# Patient Record
Sex: Female | Born: 2000
Health system: Southern US, Community
[De-identification: ages and names within clinical notes are randomized; demographics above are authoritative.]

## PROBLEM LIST (undated history)

## (undated) DIAGNOSIS — R51 Headache: Secondary | ICD-10-CM

## (undated) DIAGNOSIS — R519 Headache, unspecified: Secondary | ICD-10-CM

## (undated) HISTORY — DX: Headache, unspecified: R51.9

## (undated) HISTORY — DX: Headache: R51

## (undated) HISTORY — PX: TONSILLECTOMY AND ADENOIDECTOMY: SHX28

---
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
## 2018-01-16 DIAGNOSIS — F3181 Bipolar II disorder: Secondary | ICD-10-CM | POA: Diagnosis not present

## 2018-04-24 DIAGNOSIS — F3181 Bipolar II disorder: Secondary | ICD-10-CM | POA: Diagnosis not present

## 2018-09-11 DIAGNOSIS — F3181 Bipolar II disorder: Secondary | ICD-10-CM | POA: Diagnosis not present

## 2019-02-03 ENCOUNTER — Encounter (HOSPITAL_COMMUNITY): Payer: Self-pay | Admitting: Emergency Medicine

## 2019-02-03 ENCOUNTER — Other Ambulatory Visit: Payer: Self-pay

## 2019-02-03 ENCOUNTER — Emergency Department (HOSPITAL_COMMUNITY)
Admission: EM | Admit: 2019-02-03 | Discharge: 2019-02-03 | Disposition: A | Discharge status: Home / Self Care | Payer: BLUE CROSS/BLUE SHIELD | Attending: Emergency Medicine | Admitting: Emergency Medicine

## 2019-02-03 DIAGNOSIS — B9689 Other specified bacterial agents as the cause of diseases classified elsewhere: Secondary | ICD-10-CM

## 2019-02-03 DIAGNOSIS — N760 Acute vaginitis: Secondary | ICD-10-CM | POA: Diagnosis not present

## 2019-02-03 DIAGNOSIS — Z79899 Other long term (current) drug therapy: Secondary | ICD-10-CM | POA: Insufficient documentation

## 2019-02-03 DIAGNOSIS — Z7722 Contact with and (suspected) exposure to environmental tobacco smoke (acute) (chronic): Secondary | ICD-10-CM | POA: Diagnosis not present

## 2019-02-03 DIAGNOSIS — R1030 Lower abdominal pain, unspecified: Secondary | ICD-10-CM | POA: Diagnosis not present

## 2019-02-03 LAB — URINALYSIS, ROUTINE W REFLEX MICROSCOPIC
Bacteria, UA: NONE SEEN
Bilirubin Urine: NEGATIVE
Glucose, UA: NEGATIVE mg/dL
Hgb urine dipstick: NEGATIVE
Ketones, ur: NEGATIVE mg/dL
Nitrite: NEGATIVE
Protein, ur: NEGATIVE mg/dL
Specific Gravity, Urine: 1.029 (ref 1.005–1.030)
pH: 5 (ref 5.0–8.0)

## 2019-02-03 LAB — COMPREHENSIVE METABOLIC PANEL
ALT: 15 U/L (ref 0–44)
AST: 17 U/L (ref 15–41)
Albumin: 3.6 g/dL (ref 3.5–5.0)
Alkaline Phosphatase: 91 U/L (ref 38–126)
Anion gap: 11 (ref 5–15)
BUN: 12 mg/dL (ref 6–20)
CO2: 24 mmol/L (ref 22–32)
Calcium: 9.5 mg/dL (ref 8.9–10.3)
Chloride: 104 mmol/L (ref 98–111)
Creatinine, Ser: 0.7 mg/dL (ref 0.44–1.00)
GFR calc Af Amer: >60 mL/min (ref >60)
GFR calc non Af Amer: >60 mL/min (ref >60)
Glucose, Bld: 98 mg/dL (ref 70–99)
Potassium: 4.2 mmol/L (ref 3.5–5.1)
Sodium: 139 mmol/L (ref 135–145)
Total Bilirubin: 0.5 mg/dL (ref 0.3–1.2)
Total Protein: 6.8 g/dL (ref 6.5–8.1)

## 2019-02-03 LAB — WET PREP, GENITAL
Sperm: NONE SEEN
Trich, Wet Prep: NONE SEEN
Yeast Wet Prep HPF POC: NONE SEEN

## 2019-02-03 LAB — I-STAT BETA HCG BLOOD, ED (MC, WL, AP ONLY): I-stat hCG, quantitative: <5 m[IU]/mL (ref <5)

## 2019-02-03 LAB — CBC
HCT: 43.5 % (ref 36.0–46.0)
Hemoglobin: 13.4 g/dL (ref 12.0–15.0)
MCH: 24.9 pg — ABNORMAL LOW (ref 26.0–34.0)
MCHC: 30.8 g/dL (ref 30.0–36.0)
MCV: 80.9 fL (ref 80.0–100.0)
Platelets: 278 10*3/uL (ref 150–400)
RBC: 5.38 MIL/uL — ABNORMAL HIGH (ref 3.87–5.11)
RDW: 12.6 % (ref 11.5–15.5)
WBC: 8.2 10*3/uL (ref 4.0–10.5)
nRBC: 0 % (ref 0.0–0.2)

## 2019-02-03 LAB — LIPASE, BLOOD: Lipase: 58 U/L — ABNORMAL HIGH (ref 11–51)

## 2019-02-03 MED ORDER — SODIUM CHLORIDE 0.9% FLUSH: 3.0000 mL | Freq: Once | INTRAVENOUS | Status: DC

## 2019-02-03 MED ORDER — METRONIDAZOLE 500 MG PO TABS: 500.0000 mg | ORAL_TABLET | Freq: Two times a day (BID) | ORAL | 0 refills | Status: AC

## 2019-02-03 MED ORDER — METRONIDAZOLE 500 MG PO TABS
500.0000 mg | ORAL_TABLET | Freq: Once | ORAL | Status: AC
  Administered 2019-02-03: 500 mg via ORAL
  Filled 2019-02-03: qty 1

## 2019-02-03 NOTE — ED Provider Notes (Signed)
[facility]  CSN: [id number] Arrival date & time: 02/03/19 0424  Chief Complaint(s) Abdominal Pain  HPI Sarah Gardner is a 18 y.o. female who presents to the emergency department with several days of lower abdominal cramping.  Last menstrual.  Was approximately April 1 or 2.  Reports that she had a sexual encounter 1 week ago and had vaginal bleeding following that.  This lasted for 2 days.  States that she has had multiple negative pregnancy test at home.  Cramping is intermittent but has gradually worsened since onset.  No associated nausea, vomiting, diarrhea, urinary symptoms.  No fevers or chills.  No respiratory symptoms.  Denies any prior history of STDs and denies any vaginal discharge.  HPI  Past Medical History Past Medical History:  Diagnosis Date  . Headache    Patient Active Problem List   Diagnosis Date Noted  . Bipolar disorder, unspecified (HCC) 10/16/2017   Home Medication(s) Prior to Admission medications   Medication Sig Start Date End Date Taking? Authorizing Provider  ARIPiprazole (ABILIFY) 5 MG tablet Take 1 tablet (5 mg total) by mouth daily. For mood control 10/20/17   Money, Gerlene Burdockravis B, FNP  metroNIDAZOLE (FLAGYL) 500 MG tablet Take 1 tablet (500 mg total) by mouth 2 (two) times daily for 7 days. 02/03/19 02/10/19  Nira Connardama, Pedro Eduardo, MD                                                                                                                                    Past Surgical History Past Surgical History:  Procedure Laterality Date  . TONSILLECTOMY AND ADENOIDECTOMY     Family History Family History  Adopted: Yes    Social History Social History   Tobacco Use  . Smoking status: Passive Smoke Exposure - Never Smoker  . Smokeless tobacco: Never Used  Substance Use Topics  . Alcohol use: No  . Drug use: No   Allergies Patient has no known allergies.  Review of Systems Review of Systems  All other systems are reviewed and are negative for acute change except as noted in the HPI  Physical Exam Vital Signs  I have reviewed the triage vital signs BP 114/66 (BP Location: Right Arm)   Pulse 61   Temp 98.7 F (37.1 C) (Oral)   Resp 18   Wt 80.7 kg   LMP 01/27/2019   SpO2 100%   BMI 32.56 kg/m   Physical Exam Vitals signs reviewed. Exam conducted with a chaperone present.  Constitutional:      General: She is not in acute distress.    Appearance: She is well-developed. She is not diaphoretic.  HENT:     Head: Normocephalic and atraumatic.     Right Ear: External ear normal.     Left Ear: External ear normal.     Nose: Nose normal.  Eyes:     General: No scleral  icterus.    Conjunctiva/sclera: Conjunctivae normal.  Neck:     Musculoskeletal: Normal range of motion.     Trachea: Phonation normal.  Cardiovascular:     Rate and Rhythm: Normal rate and regular rhythm.  Pulmonary:     Effort: Pulmonary effort is normal. No respiratory distress.     Breath sounds: No stridor.  Abdominal:     General: There is no distension.     Tenderness: There is abdominal tenderness ( mild discomfort) in the suprapubic area.  Genitourinary:    Vagina: Vaginal discharge (mild mucousy discharge) and bleeding (small amount of dark coagulated blood noted ) present. No erythema or tenderness.     Cervix: No cervical motion tenderness, discharge or erythema.     Uterus: Not tender.      Adnexa:        Right: No mass or tenderness.         Left: No mass or tenderness.    Musculoskeletal: Normal range of motion.  Neurological:     Mental Status: She is alert and oriented to person, place, and time.  Psychiatric:        Behavior: Behavior normal.     ED Results and Treatments Labs (all labs ordered are listed, but only abnormal results are displayed) Labs Reviewed  WET PREP, GENITAL - Abnormal; Notable for the following components:      Result Value   Clue Cells Wet Prep HPF  POC PRESENT (*)    WBC, Wet Prep HPF POC MANY (*)    All other components within normal limits  LIPASE, BLOOD - Abnormal; Notable for the following components:   Lipase 58 (*)    All other components within normal limits  CBC - Abnormal; Notable for the following components:   RBC 5.38 (*)    MCH 24.9 (*)    All other components within normal limits  URINALYSIS, ROUTINE W REFLEX MICROSCOPIC - Abnormal; Notable for the following components:   APPearance CLOUDY (*)    Leukocytes,Ua LARGE (*)    All other components within normal limits  COMPREHENSIVE METABOLIC PANEL  I-STAT BETA HCG BLOOD, ED ([facility], [facility], [facility] ONLY)  GC/CHLAMYDIA PROBE AMP ([place]) NOT AT [facility]                                                                                                                         EKG  EKG Interpretation  Date/Time:    Ventricular Rate:    PR Interval:    QRS Duration:   QT Interval:    QTC Calculation:   R Axis:     Text Interpretation:        Radiology No results found. Pertinent labs & imaging results that were available during my care of the patient were reviewed by me and considered in my medical decision making (see chart for details).  Medications Ordered in ED Medications  sodium chloride flush (NS) 0.9 % injection 3 mL (has no administration  in time range)  metroNIDAZOLE (FLAGYL) tablet 500 mg (500 mg Oral Given 02/03/19 0644)                                                                                                                                    Procedures Procedures  (including critical care time)  Medical Decision Making / ED Course I have reviewed the nursing notes for this encounter and the patient's prior records (if available in EHR or on provided paperwork).    1 week of intermittent abdominal cramping.  Suprapubic discomfort without evidence of peritonitis.  Pelvic exam not concerning for cervicitis or PID.  Wet prep notable for bacterial  vaginosis.  Negative trichomonas.  GC/chlamydia sent.  Will await results do not believe she needs empiric treatment at this time.  Rest of the labs are reassuring without leukocytosis or anemia.  No significant electrolyte derangements.  Urine notable for leuko-urea but extremely contaminated and not suspicious for infection at this time.  The patient appears reasonably screened and/or stabilized for discharge and I doubt any other medical condition or other [facility] requiring further screening, evaluation, or treatment in the ED at this time prior to discharge.  The patient is safe for discharge with strict return precautions.   Final Clinical Impression(s) / ED Diagnoses Final diagnoses:  Bacterial vaginosis   Disposition: Discharge  Condition: Good  I have discussed the results, Dx and Tx plan with the patient who expressed understanding and agree(s) with the plan. Discharge instructions discussed at great length. The patient was given strict return precautions who verbalized understanding of the instructions. No further questions at time of discharge.    ED Discharge Orders         Ordered    metroNIDAZOLE (FLAGYL) 500 MG tablet  2 times daily     02/03/19 0654           Follow Up: Primary care provider  Schedule an appointment as soon as possible for a visit  As needed      This chart was dictated using voice recognition software.  Despite best efforts to proofread,  errors can occur which can change the documentation meaning.   Nira Conn, MD 02/03/19 [phone]

## 2019-02-03 NOTE — ED Notes (Signed)
Pt reports unprotected sex w/ a new partner approximately one week ago prior to onset of sx.

## 2019-02-03 NOTE — ED Triage Notes (Signed)
Pt in with low, cramping abdominal pain x 1 wk. Denies any n/v/d, or fevers. Endorses pain with urination. LMP 1 wk ago

## 2019-02-05 LAB — GC/CHLAMYDIA PROBE AMP (~~LOC~~) NOT AT ARMC
Chlamydia: NEGATIVE
Neisseria Gonorrhea: NEGATIVE

## 2019-08-05 ENCOUNTER — Emergency Department (HOSPITAL_COMMUNITY)
Admission: EM | Admit: 2019-08-05 | Discharge: 2019-08-05 | Disposition: A | Discharge status: Home / Self Care | Payer: BC Managed Care – PPO | Attending: Emergency Medicine | Admitting: Emergency Medicine

## 2019-08-05 ENCOUNTER — Other Ambulatory Visit: Payer: Self-pay

## 2019-08-05 ENCOUNTER — Encounter (HOSPITAL_COMMUNITY): Payer: Self-pay

## 2019-08-05 ENCOUNTER — Emergency Department (HOSPITAL_COMMUNITY): Payer: BC Managed Care – PPO

## 2019-08-05 DIAGNOSIS — R0789 Other chest pain: Secondary | ICD-10-CM | POA: Diagnosis not present

## 2019-08-05 DIAGNOSIS — R079 Chest pain, unspecified: Secondary | ICD-10-CM | POA: Diagnosis not present

## 2019-08-05 DIAGNOSIS — Z7722 Contact with and (suspected) exposure to environmental tobacco smoke (acute) (chronic): Secondary | ICD-10-CM | POA: Diagnosis not present

## 2019-08-05 DIAGNOSIS — Z79899 Other long term (current) drug therapy: Secondary | ICD-10-CM | POA: Insufficient documentation

## 2019-08-05 LAB — BASIC METABOLIC PANEL
Anion gap: 10 (ref 5–15)
BUN: 15 mg/dL (ref 6–20)
CO2: 23 mmol/L (ref 22–32)
Calcium: 9 mg/dL (ref 8.9–10.3)
Chloride: 108 mmol/L (ref 98–111)
Creatinine, Ser: 0.73 mg/dL (ref 0.44–1.00)
GFR calc Af Amer: >60 mL/min (ref >60)
GFR calc non Af Amer: >60 mL/min (ref >60)
Glucose, Bld: 102 mg/dL — ABNORMAL HIGH (ref 70–99)
Potassium: 4.1 mmol/L (ref 3.5–5.1)
Sodium: 141 mmol/L (ref 135–145)

## 2019-08-05 LAB — TROPONIN I (HIGH SENSITIVITY)
Troponin I (High Sensitivity): <2 ng/L (ref <18)
Troponin I (High Sensitivity): <2 ng/L (ref <18)

## 2019-08-05 LAB — CBC
HCT: 40.9 % (ref 36.0–46.0)
Hemoglobin: 13.4 g/dL (ref 12.0–15.0)
MCH: 26.5 pg (ref 26.0–34.0)
MCHC: 32.8 g/dL (ref 30.0–36.0)
MCV: 81 fL (ref 80.0–100.0)
Platelets: 267 10*3/uL (ref 150–400)
RBC: 5.05 MIL/uL (ref 3.87–5.11)
RDW: 13.3 % (ref 11.5–15.5)
WBC: 6.2 10*3/uL (ref 4.0–10.5)
nRBC: 0 % (ref 0.0–0.2)

## 2019-08-05 LAB — I-STAT BETA HCG BLOOD, ED (MC, WL, AP ONLY): I-stat hCG, quantitative: <5 m[IU]/mL (ref <5)

## 2019-08-05 MED ORDER — SODIUM CHLORIDE 0.9% FLUSH: 3.0000 mL | Freq: Once | INTRAVENOUS | Status: DC

## 2019-08-05 NOTE — Discharge Instructions (Addendum)
Thank you for allowing me to care for you today. Please return to the emergency department if you have new or worsening symptoms. Take your medications as instructed.  ° °

## 2019-08-05 NOTE — ED Provider Notes (Signed)
[facility] Provider Note   CSN: [id number] Arrival date & time: 08/05/19  0252     History   Chief Complaint Chief Complaint  Patient presents with  . Chest Pain    HPI Sarah Gardner is a 18 y.o. female.     18 year old female with past medical history of bipolar disorder presenting to the emergency department for chest tightness.  Reports that it woke her up from her sleep around 2 AM and lasted till about 6:30 AM.  Reports that it felt tight and sharp.  No exacerbating or relieving factors.  History of the same in the past for a couple of episodes which were brief and resolved on their own.  Denies any pain or discomfort at this time.  Denies any shortness of breath, cough, fever, alcohol or drug use.     Past Medical History:  Diagnosis Date  . Headache     Patient Active Problem List   Diagnosis Date Noted  . Bipolar disorder, unspecified (HCC) 10/16/2017    Past Surgical History:  Procedure Laterality Date  . TONSILLECTOMY AND ADENOIDECTOMY       OB History   No obstetric history on file.      Home Medications    Prior to Admission medications   Medication Sig Start Date End Date Taking? Authorizing Provider  ARIPiprazole (ABILIFY) 5 MG tablet Take 1 tablet (5 mg total) by mouth daily. For mood control 10/20/17   Money, Gerlene Burdockravis B, FNP    Family History Family History  Adopted: Yes    Social History Social History   Tobacco Use  . Smoking status: Passive Smoke Exposure - Never Smoker  . Smokeless tobacco: Never Used  Substance Use Topics  . Alcohol use: No  . Drug use: No     Allergies   Patient has no known allergies.   Review of Systems Review of Systems  Constitutional: Negative for chills and fever.  HENT: Negative for ear pain and sore throat.   Eyes: Negative for pain and visual disturbance.  Respiratory: Negative for cough and shortness of breath.   Cardiovascular: Positive for chest  pain. Negative for palpitations.  Gastrointestinal: Negative for abdominal pain, nausea and vomiting.  Genitourinary: Negative for dysuria and hematuria.  Musculoskeletal: Negative for arthralgias and back pain.  Skin: Negative for color change and rash.  Neurological: Negative for dizziness, syncope and headaches.  All other systems reviewed and are negative.    Physical Exam Updated Vital Signs BP 114/77 (BP Location: Right Arm)   Pulse (!) 55   Temp 98.2 F (36.8 C) (Oral)   Resp 16   Ht 5\' 2"  (1.575 m)   Wt 83.9 kg   SpO2 99%   BMI 33.84 kg/m   Physical Exam Vitals signs and nursing note reviewed.  Constitutional:      General: She is not in acute distress.    Appearance: Normal appearance. She is well-developed. She is not ill-appearing or toxic-appearing.  HENT:     Head: Normocephalic.  Eyes:     Conjunctiva/sclera: Conjunctivae normal.     Pupils: Pupils are equal, round, and reactive to light.  Cardiovascular:     Rate and Rhythm: Normal rate and regular rhythm.     Heart sounds: Normal heart sounds.  Pulmonary:     Effort: Pulmonary effort is normal.     Breath sounds: Normal breath sounds.  Chest:     Chest wall: Tenderness (over the sternum) present.  Musculoskeletal:     Right lower leg: No edema.     Left lower leg: No edema.  Skin:    General: Skin is warm and dry.  Neurological:     General: No focal deficit present.     Mental Status: She is alert and oriented to person, place, and time.  Psychiatric:        Mood and Affect: Mood normal.      ED Treatments / Results  Labs (all labs ordered are listed, but only abnormal results are displayed) Labs Reviewed  BASIC METABOLIC PANEL - Abnormal; Notable for the following components:      Result Value   Glucose, Bld 102 (*)    All other components within normal limits  CBC  I-STAT BETA HCG BLOOD, ED ([facility], [facility], [facility] ONLY)  TROPONIN I (HIGH SENSITIVITY)  TROPONIN I (HIGH SENSITIVITY)    EKG  EKG Interpretation  Date/Time:  Sunday August 05 2019 02:58:31 EDT Ventricular Rate:  74 PR Interval:  150 QRS Duration: 98 QT Interval:  386 QTC Calculation: 428 R Axis:   74 Text Interpretation:  Normal sinus rhythm Abnormal ECG No STEMI  Confirmed by Octaviano Glow [phone]) on 08/05/2019 8:22:13 AM   Radiology Dg Chest 2 View  Result Date: 08/05/2019 CLINICAL DATA:  Chest pain. Awoke with chest tightness. EXAM: CHEST - 2 VIEW COMPARISON:  None. FINDINGS: Lung volumes are low.The cardiomediastinal contours are normal. The lungs are clear. Pulmonary vasculature is normal. No consolidation, pleural effusion, or pneumothorax. No acute osseous abnormalities are seen. IMPRESSION: Low lung volumes without acute abnormality. Electronically Signed   By: Keith Rake M.D.   On: 08/05/2019 03:18    Procedures Procedures (including critical care time)  Medications Ordered in ED Medications  sodium chloride flush (NS) 0.9 % injection 3 mL (has no administration in time range)     Initial Impression / Assessment and Plan / ED Course  I have reviewed the triage vital signs and the nursing notes.  Pertinent labs & imaging results that were available during my care of the patient were reviewed by me and considered in my medical decision making (see chart for details).  Clinical Course as of Aug 04 833  Sun Aug 05, 2019  0817 17 y/o with atypical chest pain lasting 4 hours, now resolved. No associated SOB. PERC negative. Trop <2 x2.  And chest xray unremarkable. EKG reviewed by myself and Dr. Langston Masker - appears similar to previous.    [KM]    Clinical Course User Index [KM] Alveria Apley, PA-C       Based on review of vitals, medical screening exam, lab work and/or imaging, there does not appear to be an acute, emergent etiology for the patient's symptoms. Counseled pt on good return precautions and encouraged both PCP and ED follow-up as needed.  Prior to discharge, I also  discussed incidental imaging findings with patient in detail and advised appropriate, recommended follow-up in detail.  Clinical Impression: 1. Atypical chest pain     Disposition: Discharge  Prior to providing a prescription for a controlled substance, I independently reviewed the patient's recent prescription history on the [place]. The patient had no recent or regular prescriptions and was deemed appropriate for a brief, less than 3 day prescription of narcotic for acute analgesia.  This note was prepared with assistance of Systems analyst. Occasional wrong-word or sound-a-like substitutions may have occurred due to the inherent limitations of voice  recognition software.   Final Clinical Impressions(s) / ED Diagnoses   Final diagnoses:  Atypical chest pain    ED Discharge Orders    None       Jeral Pinch 08/05/19 0086    Terald Sleeper, MD 08/05/19 [phone]

## 2019-08-05 NOTE — ED Triage Notes (Signed)
Pt reports chest tightness that woke her up out of her sleep. Denies any other symptoms. Pt a.o, nad noted.

## 2019-08-21 ENCOUNTER — Other Ambulatory Visit: Payer: Self-pay

## 2019-08-22 ENCOUNTER — Ambulatory Visit: Payer: Self-pay | Admitting: Women's Health

## 2019-08-22 DIAGNOSIS — Z0289 Encounter for other administrative examinations: Secondary | ICD-10-CM

## 2019-08-23 ENCOUNTER — Other Ambulatory Visit: Payer: Self-pay

## 2019-08-27 ENCOUNTER — Other Ambulatory Visit: Payer: Self-pay

## 2019-08-27 ENCOUNTER — Encounter: Payer: Self-pay | Admitting: Women's Health

## 2019-08-27 ENCOUNTER — Ambulatory Visit (INDEPENDENT_AMBULATORY_CARE_PROVIDER_SITE_OTHER): Payer: BC Managed Care – PPO | Admitting: Women's Health

## 2019-08-27 VITALS — BP 120/78 | Ht 61.0 in | Wt 194.0 lb

## 2019-08-27 DIAGNOSIS — Z113 Encounter for screening for infections with a predominantly sexual mode of transmission: Secondary | ICD-10-CM | POA: Diagnosis not present

## 2019-08-27 DIAGNOSIS — R7309 Other abnormal glucose: Secondary | ICD-10-CM | POA: Diagnosis not present

## 2019-08-27 DIAGNOSIS — R635 Abnormal weight gain: Secondary | ICD-10-CM | POA: Diagnosis not present

## 2019-08-27 DIAGNOSIS — Z01419 Encounter for gynecological examination (general) (routine) without abnormal findings: Secondary | ICD-10-CM

## 2019-08-27 LAB — WET PREP FOR TRICH, YEAST, CLUE

## 2019-08-27 MED ORDER — METRONIDAZOLE 500 MG PO TABS: 500.0000 mg | ORAL_TABLET | Freq: Two times a day (BID) | ORAL | 0 refills | Status: DC

## 2019-08-27 MED ORDER — DROSPIRENONE-ETHINYL ESTRADIOL 3-0.02 MG PO TABS: 1.0000 | ORAL_TABLET | Freq: Every day | ORAL | 4 refills | Status: DC

## 2019-08-27 MED ORDER — FLUCONAZOLE 150 MG PO TABS: 150.0000 mg | ORAL_TABLET | Freq: Once | ORAL | 1 refills | Status: AC

## 2019-08-27 NOTE — Patient Instructions (Signed)
MVI daily It was nice meeting you!  Carbohydrate Counting for Diabetes Mellitus, Adult  Carbohydrate counting is a method of keeping track of how many carbohydrates you eat. Eating carbohydrates naturally increases the amount of sugar (glucose) in the blood. Counting how many carbohydrates you eat helps keep your blood glucose within normal limits, which helps you manage your diabetes (diabetes mellitus). It is important to know how many carbohydrates you can safely have in each meal. This is different for every person. A diet and nutrition specialist (registered dietitian) can help you make a meal plan and calculate how many carbohydrates you should have at each meal and snack. Carbohydrates are found in the following foods:  Grains, such as breads and cereals.  Dried beans and soy products.  Starchy vegetables, such as potatoes, peas, and corn.  Fruit and fruit juices.  Milk and yogurt.  Sweets and snack foods, such as cake, cookies, candy, chips, and soft drinks. How do I count carbohydrates? There are two ways to count carbohydrates in food. You can use either of the methods or a combination of both. Reading "Nutrition Facts" on packaged food The "Nutrition Facts" list is included on the labels of almost all packaged foods and beverages in the [place] It includes:  The serving size.  Information about nutrients in each serving, including the grams (g) of carbohydrate per serving. To use the "Nutrition Facts":  Decide how many servings you will have.  Multiply the number of servings by the number of carbohydrates per serving.  The resulting number is the total amount of carbohydrates that you will be having. Learning standard serving sizes of other foods When you eat carbohydrate foods that are not packaged or do not include "Nutrition Facts" on the label, you need to measure the servings in order to count the amount of carbohydrates:  Measure the foods that you will eat with a  food scale or measuring cup, if needed.  Decide how many standard-size servings you will eat.  Multiply the number of servings by 15. Most carbohydrate-rich foods have about 15 g of carbohydrates per serving. ? For example, if you eat 8 oz (170 g) of strawberries, you will have eaten 2 servings and 30 g of carbohydrates (2 servings x 15 g = 30 g).  For foods that have more than one food mixed, such as soups and casseroles, you must count the carbohydrates in each food that is included. The following list contains standard serving sizes of common carbohydrate-rich foods. Each of these servings has about 15 g of carbohydrates:   hamburger bun or  English muffin.   oz (15 mL) syrup.   oz (14 g) jelly.  1 slice of bread.  1 six-inch tortilla.  3 oz (85 g) cooked rice or pasta.  4 oz (113 g) cooked dried beans.  4 oz (113 g) starchy vegetable, such as peas, corn, or potatoes.  4 oz (113 g) hot cereal.  4 oz (113 g) mashed potatoes or  of a large baked potato.  4 oz (113 g) canned or frozen fruit.  4 oz (120 mL) fruit juice.  4-6 crackers.  6 chicken nuggets.  6 oz (170 g) unsweetened dry cereal.  6 oz (170 g) plain fat-free yogurt or yogurt sweetened with artificial sweeteners.  8 oz (240 mL) milk.  8 oz (170 g) fresh fruit or one small piece of fruit.  24 oz (680 g) popped popcorn. Example of carbohydrate counting Sample meal  3 oz (85  g) chicken breast.  6 oz (170 g) brown rice.  4 oz (113 g) corn.  8 oz (240 mL) milk.  8 oz (170 g) strawberries with sugar-free whipped topping. Carbohydrate calculation 1. Identify the foods that contain carbohydrates: ? Rice. ? Corn. ? Milk. ? Strawberries. 2. Calculate how many servings you have of each food: ? 2 servings rice. ? 1 serving corn. ? 1 serving milk. ? 1 serving strawberries. 3. Multiply each number of servings by 15 g: ? 2 servings rice x 15 g = 30 g. ? 1 serving corn x 15 g = 15 g. ? 1 serving  milk x 15 g = 15 g. ? 1 serving strawberries x 15 g = 15 g. 4. Add together all of the amounts to find the total grams of carbohydrates eaten: ? 30 g + 15 g + 15 g + 15 g = 75 g of carbohydrates total. Summary  Carbohydrate counting is a method of keeping track of how many carbohydrates you eat.  Eating carbohydrates naturally increases the amount of sugar (glucose) in the blood.  Counting how many carbohydrates you eat helps keep your blood glucose within normal limits, which helps you manage your diabetes.  A diet and nutrition specialist (registered dietitian) can help you make a meal plan and calculate how many carbohydrates you should have at each meal and snack. This information is not intended to replace advice given to you by your health care provider. Make sure you discuss any questions you have with your health care provider. Document Released: 10/04/2005 Document Revised: 04/28/2017 Document Reviewed: 03/17/2016 Elsevier Patient Education  2020 [employer]. Health Maintenance, Female Adopting a healthy lifestyle and getting preventive care are important in promoting health and wellness. Ask your health care provider about:  The right schedule for you to have regular tests and exams.  Things you can do on your own to prevent diseases and keep yourself healthy. What should I know about diet, weight, and exercise? Eat a healthy diet   Eat a diet that includes plenty of vegetables, fruits, low-fat dairy products, and lean protein.  Do not eat a lot of foods that are high in solid fats, added sugars, or sodium. Maintain a healthy weight Body mass index (BMI) is used to identify weight problems. It estimates body fat based on height and weight. Your health care provider can help determine your BMI and help you achieve or maintain a healthy weight. Get regular exercise Get regular exercise. This is one of the most important things you can do for your health. Most adults should:   Exercise for at least 150 minutes each week. The exercise should increase your heart rate and make you sweat (moderate-intensity exercise).  Do strengthening exercises at least twice a week. This is in addition to the moderate-intensity exercise.  Spend less time sitting. Even light physical activity can be beneficial. Watch cholesterol and blood lipids Have your blood tested for lipids and cholesterol at 20 years of age, then have this test every 5 years. Have your cholesterol levels checked more often if:  Your lipid or cholesterol levels are high.  You are older than 20 years of age.  You are at high risk for heart disease. What should I know about cancer screening? Depending on your health history and family history, you may need to have cancer screening at various ages. This may include screening for:  Breast cancer.  Cervical cancer.  Colorectal cancer.  Skin cancer.  Lung cancer.  What should I know about heart disease, diabetes, and high blood pressure? Blood pressure and heart disease  High blood pressure causes heart disease and increases the risk of stroke. This is more likely to develop in people who have high blood pressure readings, are of African descent, or are overweight.  Have your blood pressure checked: ? Every 3-5 years if you are 43-45 years of age. ? Every year if you are 16 years old or older. Diabetes Have regular diabetes screenings. This checks your fasting blood sugar level. Have the screening done:  Once every three years after age 65 if you are at a normal weight and have a low risk for diabetes.  More often and at a younger age if you are overweight or have a high risk for diabetes. What should I know about preventing infection? Hepatitis B If you have a higher risk for hepatitis B, you should be screened for this virus. Talk with your health care provider to find out if you are at risk for hepatitis B infection. Hepatitis C Testing is  recommended for:  Everyone born from [birth year] through 1967.  Anyone with known risk factors for hepatitis C. Sexually transmitted infections (STIs)  Get screened for STIs, including gonorrhea and chlamydia, if: ? You are sexually active and are younger than 20 years of age. ? You are older than 20 years of age and your health care provider tells you that you are at risk for this type of infection. ? Your sexual activity has changed since you were last screened, and you are at increased risk for chlamydia or gonorrhea. Ask your health care provider if you are at risk.  Ask your health care provider about whether you are at high risk for HIV. Your health care provider may recommend a prescription medicine to help prevent HIV infection. If you choose to take medicine to prevent HIV, you should first get tested for HIV. You should then be tested every 3 months for as long as you are taking the medicine. Pregnancy  If you are about to stop having your period (premenopausal) and you may become pregnant, seek counseling before you get pregnant.  Take 400 to 800 micrograms (mcg) of folic acid every day if you become pregnant.  Ask for birth control (contraception) if you want to prevent pregnancy. Osteoporosis and menopause Osteoporosis is a disease in which the bones lose minerals and strength with aging. This can result in bone fractures. If you are 44 years old or older, or if you are at risk for osteoporosis and fractures, ask your health care provider if you should:  Be screened for bone loss.  Take a calcium or vitamin D supplement to lower your risk of fractures.  Be given hormone replacement therapy (HRT) to treat symptoms of menopause. Follow these instructions at home: Lifestyle  Do not use any products that contain nicotine or tobacco, such as cigarettes, e-cigarettes, and chewing tobacco. If you need help quitting, ask your health care provider.  Do not use street drugs.  Do not  share needles.  Ask your health care provider for help if you need support or information about quitting drugs. Alcohol use  Do not drink alcohol if: ? Your health care provider tells you not to drink. ? You are pregnant, may be pregnant, or are planning to become pregnant.  If you drink alcohol: ? Limit how much you use to 0-1 drink a day. ? Limit intake if you are breastfeeding.  Be aware  of how much alcohol is in your drink. In the [place] one drink equals one 12 oz bottle of beer (355 mL), one 5 oz glass of wine (148 mL), or one 1 oz glass of hard liquor (44 mL). General instructions  Schedule regular health, dental, and eye exams.  Stay current with your vaccines.  Tell your health care provider if: ? You often feel depressed. ? You have ever been abused or do not feel safe at home. Summary  Adopting a healthy lifestyle and getting preventive care are important in promoting health and wellness.  Follow your health care provider's instructions about healthy diet, exercising, and getting tested or screened for diseases.  Follow your health care provider's instructions on monitoring your cholesterol and blood pressure. This information is not intended to replace advice given to you by your health care provider. Make sure you discuss any questions you have with your health care provider. Document Released: 04/19/2011 Document Revised: 09/27/2018 Document Reviewed: 09/27/2018 Elsevier Patient Education  2020 [employer]

## 2019-08-27 NOTE — Progress Notes (Signed)
Dyani Babel 07-Aug-2001 [id number]    History:    Presents for annual exam.  Regular monthly cycle, new partner.  Thinks she has received Gardasil.  Normal CBC 07/2019.  Reports gaining about 40 pounds in the last 6 months she relates to overeating due to Covid.  History of anxiety and depression currently on no medication and states is doing well.  Past medical history, past surgical history, family history and social history were all reviewed and documented in the EPIC chart.  Adopted no known family history.  Works in a factory.  ROS:  A ROS was performed and pertinent positives and negatives are included.  Exam:  Vitals:   08/27/19 1122  BP: 120/78  Weight: 194 lb (88 kg)  Height: 5\' 1"  (1.549 m)   Body mass index is 36.66 kg/m.   General appearance:  Normal Thyroid:  Symmetrical, normal in size, without palpable masses or nodularity. Respiratory  Auscultation:  Clear without wheezing or rhonchi Cardiovascular  Auscultation:  Regular rate, without rubs, murmurs or gallops  Edema/varicosities:  Not grossly evident Abdominal  Soft,nontender, without masses, guarding or rebound.  Liver/spleen:  No organomegaly noted  Hernia:  None appreciated  Skin  Inspection:  Grossly normal   Breasts: Examined lying and sitting.     Right: Without masses, retractions, discharge or axillary adenopathy.     Left: Without masses, retractions, discharge or axillary adenopathy. Gentitourinary   Inguinal/mons:  Normal without inguinal adenopathy  External genitalia:  Normal  BUS/Urethra/Skene's glands:  Normal  Vagina:  Normal scant menses, wet prep positive for clues, TNTC bacteria  Cervix:  Normal  Uterus:  normal in size, shape and contour.  Midline and mobile  Adnexa/parametria:     Rt: Without masses or tenderness.   Lt: Without masses or tenderness.  Anus and perineum: Normal    Assessment/Plan:  18 y.o. S BF G0 for annual exam requesting contraception.  Regular monthly  cycle Bacterial vaginosis STD screen Obesity History of anxiety/depression reports doing well currently on no medication  Plan: Contraception options reviewed would like a pill, yes prescription, proper use, slight risk for blood clots and strokes reviewed.  On last day of cycle will start today.  Reviewed importance of condoms especially first month and for infection control.  Flagyl 500 twice daily for the 7 days, alcohol precautions reviewed.  SBEs, exercise, calcium rich foods, MVI daily, leisure activities and self-care encouraged.  Reviewed importance of increasing exercise and decreasing calories/carbs.  Hemoglobin A1c, HIV, RPR, GC/chlamydia, TSH, has had a recent 40 pound weight gain. Instructed to return to office if she has not received Gardasil to complete the series.    [place], 1:59 PM 08/27/2019

## 2019-08-28 LAB — C. TRACHOMATIS/N. GONORRHOEAE RNA
C. trachomatis RNA, TMA: DETECTED — AB
N. gonorrhoeae RNA, TMA: NOT DETECTED

## 2019-08-30 ENCOUNTER — Other Ambulatory Visit: Payer: Self-pay | Admitting: *Deleted

## 2019-08-31 ENCOUNTER — Other Ambulatory Visit: Payer: Self-pay | Admitting: *Deleted

## 2019-08-31 MED ORDER — AZITHROMYCIN 500 MG PO TABS: ORAL_TABLET | ORAL | 0 refills | Status: DC

## 2019-09-04 ENCOUNTER — Telehealth: Payer: Self-pay

## 2019-09-04 NOTE — Telephone Encounter (Signed)
Thanks you gave all the great information.

## 2019-09-04 NOTE — Telephone Encounter (Signed)
Patient called with question about pos Chlamydia result. She said she was reading on internet and read about PID and wanted to know if we tested for it.  I  Reassured her and explained that is a pelvic infection that occurs when disease goes untreated.  I told her it was unlikely she had it since no pain or tenderness and just tested in April.  Reminded her to inform partner and make sure they both have neg TOC prior to intercourse.  We confirmed her TOC date as well.  Also, reminded her to have blood work done at [facility] visit.

## 2019-09-04 NOTE — Telephone Encounter (Signed)
Patient called in voice mail and said she had more questions about her screening result.  I called her back and left message to call.

## 2019-09-17 ENCOUNTER — Other Ambulatory Visit: Payer: Self-pay

## 2019-09-18 ENCOUNTER — Ambulatory Visit (INDEPENDENT_AMBULATORY_CARE_PROVIDER_SITE_OTHER): Payer: BC Managed Care – PPO | Admitting: Women's Health

## 2019-09-18 ENCOUNTER — Encounter: Payer: Self-pay | Admitting: Women's Health

## 2019-09-18 VITALS — BP 118/80

## 2019-09-18 DIAGNOSIS — R635 Abnormal weight gain: Secondary | ICD-10-CM | POA: Diagnosis not present

## 2019-09-18 DIAGNOSIS — Z113 Encounter for screening for infections with a predominantly sexual mode of transmission: Secondary | ICD-10-CM

## 2019-09-18 DIAGNOSIS — R7309 Other abnormal glucose: Secondary | ICD-10-CM | POA: Diagnosis not present

## 2019-09-18 MED ORDER — AZITHROMYCIN 500 MG PO TABS: ORAL_TABLET | ORAL | 0 refills | Status: DC

## 2019-09-18 NOTE — Patient Instructions (Signed)
Chlamydia, Female Chlamydia is an STD (sexually transmitted disease). It is a bacterial infection that spreads (is contagious) through sexual contact. Chlamydia can occur in different areas of the body, including:  The tube that moves urine from the bladder out of the body (urethra).  The lower part of the uterus (cervix).  The throat.  The rectum. This condition is not difficult to treat. However, if left untreated, chlamydia can lead to more serious health problems, including pelvic inflammatory disorder (PID). PID can increase your risk of not being able to have children (sterility). Also, if chlamydia is left untreated and you are pregnant or become pregnant, there is a chance that your baby can become infected during delivery. This may cause serious health problems for the baby. What are the causes? Chlamydia is caused by the bacteria Chlamydia trachomatis. It is passed from an infected partner during sexual activity. Chlamydia can spread through contact with the genitals, mouth, or rectum. What are the signs or symptoms? In some cases, there may not be any symptoms for this condition (asymptomatic), especially early in the infection. If symptoms develop, they may include:  Burning with urination.  Frequent urination.  Vaginal discharge.  Redness, soreness, and swelling (inflammation) of the rectum.  Bleeding or discharge from the rectum.  Abdominal pain.  Pain during sexual intercourse.  Bleeding between menstrual periods.  Itching, burning, or redness in the eyes, or discharge from the eyes. How is this diagnosed? This condition may be diagnosed with:  Urine tests.  Swab tests. Depending on your symptoms, your health care provider may use a cotton swab to collect discharge from your vagina or rectum to test for the bacteria.  A pelvic exam. How is this treated? This condition is treated with antibiotic medicines. If you are pregnant, certain types of antibiotics will  need to be avoided. Follow these instructions at home: Medicines  Take over-the-counter and prescription medicines only as told by your health care provider.  Take your antibiotic medicine as told by your health care provider. Do not stop taking the antibiotic even if you start to feel better. Sexual activity  Tell sexual partners about your infection. This includes any oral, anal, or vaginal sex partners you have had within 60 days of when your symptoms started. Sexual partners should also be treated, even if they have no signs of the disease.  Do not have sex until you and your sexual partners have completed treatment and your health care provider says it is okay. If your health care provider prescribed you a single dose treatment, wait 7 days after taking the treatment before having sex. General instructions  It is your responsibility to get your test results. Ask your health care provider, or the department performing the test, when your results will be ready.  Get plenty of rest.  Eat a healthy, well-balanced diet.  Drink enough fluids to keep your urine clear or pale yellow.  Keep all follow-up visits as told by your health care provider. This is important. You may need to be tested for infection again 3 months after treatment. How is this prevented? The only sure way to prevent chlamydia is to avoid having sex. However, you can lower your risk by:  Using latex condoms correctly every time you have sex.  Not having multiple sexual partners.  Asking if your sexual partner has been tested for STIs and had negative results. Contact a health care provider if:  You develop new symptoms or your symptoms do not get   better after completing treatment.  You have a fever or chills.  You have pain during sexual intercourse. Get help right away if:  Your pain gets worse and does not get better with medicine.  You develop flu-like symptoms, such as night sweats, sore throat, or  muscle aches.  You experience nausea or vomiting.  You have difficulty swallowing.  You have bleeding between periods or after sex.  You have irregular menstrual periods.  You have abdominal or lower back pain that does not get better with medicine.  You feel weak or dizzy, or you faint.  You are pregnant and you develop symptoms of chlamydia. Summary  Chlamydia is an STD (sexually transmitted disease). It is a bacterial infection that spreads (is contagious) through sexual contact.  This condition is not difficult to treat, however. If left untreated, chlamydia can lead to more serious health problems, including pelvic inflammatory disease (PID).  In some cases, there may not be any symptoms for this condition (asymptomatic).  This condition is treated with antibiotic medicines.  Using latex condoms correctly every time you have sex can help prevent chlamydia. This information is not intended to replace advice given to you by your health care provider. Make sure you discuss any questions you have with your health care provider. Document Released: 07/14/2005 Document Revised: 03/28/2018 Document Reviewed: 09/20/2016 Elsevier Patient Education  2020 [employer] Inc.  

## 2019-09-18 NOTE — Progress Notes (Signed)
18 year old S BF G0 presents for test of cure chlamydia.  Treated with Zithromax 1 g.  08/27/2019 Chlamydia found at annual exam with routine screening asymptomatic.  States past partner positive chlamydia 4/ 2020, she was tested 01/2019 was negative and not treated.  States current partner had STD screen negative chlamydia and was not treated.  Denies vaginal discharge, urinary symptoms, abdominal pain, or fever.  Monthly cycle/condoms no known medical problems.  Was prescribed Yaz at annual exam has not started yet.  Works in a factory.  Exam: Appears well.  External genitalia within normal limits, speculum exam no discharge or lesions.  GC/Chlamydia culture taken, bimanual no CMT or adnexal tenderness.  Test of cure chlamydia  Plan: Left office prior to lab work at annual exam we will check HIV, RPR.  Prescription for Zithromax 1 g for partner, reviewed importance of abstaining or condom for at least 1 month.  Contraception reviewed, Yaz start up instructions discussed.  Reviewed importance of condoms especially for the first month and infection control until permanent partner.

## 2019-09-19 LAB — RPR: RPR Ser Ql: NONREACTIVE

## 2019-09-19 LAB — TSH: TSH: 1.22 mIU/L

## 2019-09-19 LAB — HEMOGLOBIN A1C
Hgb A1c MFr Bld: 5.4 % of total Hgb (ref <5.7)
Mean Plasma Glucose: 108 (calc)
eAG (mmol/L): 6 (calc)

## 2019-09-19 LAB — C. TRACHOMATIS/N. GONORRHOEAE RNA
C. trachomatis RNA, TMA: NOT DETECTED
N. gonorrhoeae RNA, TMA: NOT DETECTED

## 2019-09-19 LAB — HIV ANTIBODY (ROUTINE TESTING W REFLEX): HIV 1&2 Ab, 4th Generation: NONREACTIVE

## 2019-09-24 ENCOUNTER — Encounter: Payer: Self-pay | Admitting: Women's Health

## 2019-10-25 ENCOUNTER — Other Ambulatory Visit: Payer: Self-pay

## 2019-10-27 ENCOUNTER — Other Ambulatory Visit: Payer: Self-pay

## 2019-10-27 ENCOUNTER — Encounter (HOSPITAL_COMMUNITY): Payer: Self-pay

## 2019-10-27 ENCOUNTER — Ambulatory Visit (HOSPITAL_COMMUNITY)
Admission: EM | Admit: 2019-10-27 | Discharge: 2019-10-27 | Disposition: A | Discharge status: Home / Self Care | Payer: BC Managed Care – PPO | Attending: Emergency Medicine | Admitting: Emergency Medicine

## 2019-10-27 DIAGNOSIS — N764 Abscess of vulva: Secondary | ICD-10-CM | POA: Insufficient documentation

## 2019-10-27 DIAGNOSIS — Z113 Encounter for screening for infections with a predominantly sexual mode of transmission: Secondary | ICD-10-CM | POA: Diagnosis not present

## 2019-10-27 LAB — HIV ANTIBODY (ROUTINE TESTING W REFLEX): HIV Screen 4th Generation wRfx: NONREACTIVE

## 2019-10-27 MED ORDER — MUPIROCIN 2 % EX OINT: 1.0000 "application " | TOPICAL_OINTMENT | Freq: Two times a day (BID) | CUTANEOUS | 0 refills | Status: DC

## 2019-10-27 NOTE — ED Triage Notes (Signed)
Pt states has new "partner" and requests testing for STI. Denies any vaginal discharge,bleeding or odor, no abd pain. States has "bump" on vaginal area. Denies dysuria sx.

## 2019-10-27 NOTE — Discharge Instructions (Signed)
Apply warm compress to bump, bactroban twice daily, may drain on its own, follow up if not resolving, developing multiple lesions, increased pain or swelling with bump  We are testing you for Gonorrhea, Chlamydia, Trichomonas, HIV, and Syphillis. We will call you if anything is positive and let you know if you require any further treatment. Please inform partners of any positive results.   Please return if symptoms not improving with treatment, development of fever, nausea, vomiting, abdominal pain.

## 2019-10-28 LAB — RPR: RPR Ser Ql: NONREACTIVE

## 2019-10-28 NOTE — ED Provider Notes (Signed)
[facility]    CSN: [id number] Arrival date & time: 10/27/19  1324      History   Chief Complaint Chief Complaint  Patient presents with  . Exposure to STD    HPI Sarah Gardner is a 19 y.o. female history of bipolar disorder presenting today for STD screening and evaluation of genital bumps.  Patient states that she has had a new sexual partner recently and would like to be screened for STDs for safety.  She denies any symptoms or any known exposures.  Denies pelvic pain, fevers, nausea or vomiting.  Denies abnormal discharge, itching or irritation.  Denies dysuria, increased frequency or urgency.  She has noted a bump on her vaginal area that occasionally expresses white substance.  Has had this previously and resolved on its own.  Has some tenderness associated with it.  HPI  Past Medical History:  Diagnosis Date  . Headache     Patient Active Problem List   Diagnosis Date Noted  . Bipolar disorder, unspecified (HCC) 10/16/2017    Past Surgical History:  Procedure Laterality Date  . TONSILLECTOMY AND ADENOIDECTOMY      OB History    Gravida  0   Para  0   Term  0   Preterm  0   AB  0   Living  0     SAB  0   TAB  0   Ectopic  0   Multiple  0   Live Births  0            Home Medications    Prior to Admission medications   Medication Sig Start Date End Date Taking? Authorizing Provider  mupirocin ointment (BACTROBAN) 2 % Apply 1 application topically 2 (two) times daily. 10/27/19   Ailana Cuadrado, Junius Creamer, PA-C    Family History Family History  Adopted: Yes  Problem Relation Age of Onset  . Obesity Mother   . Hypertension Father     Social History Social History   Tobacco Use  . Smoking status: Passive Smoke Exposure - Never Smoker  . Smokeless tobacco: Never Used  Substance Use Topics  . Alcohol use: No  . Drug use: No     Allergies   Patient has no known allergies.   Review of Systems Review of Systems   Constitutional: Negative for fever.  Respiratory: Negative for shortness of breath.   Cardiovascular: Negative for chest pain.  Gastrointestinal: Negative for abdominal pain, diarrhea, nausea and vomiting.  Genitourinary: Positive for genital sores. Negative for dysuria, flank pain, hematuria, menstrual problem, vaginal bleeding, vaginal discharge and vaginal pain.  Musculoskeletal: Negative for back pain.  Skin: Negative for rash.  Neurological: Negative for dizziness, light-headedness and headaches.     Physical Exam Triage Vital Signs ED Triage Vitals  Enc Vitals Group     BP 10/27/19 1536 117/77     Pulse Rate 10/27/19 1536 65     Resp 10/27/19 1536 18     Temp 10/27/19 1536 98.4 F (36.9 C)     Temp Source 10/27/19 1536 Oral     SpO2 10/27/19 1536 100 %     Weight --      Height --      Head Circumference --      Peak Flow --      Pain Score 10/27/19 1534 0     Pain Loc --      Pain Edu? --      Excl. in GC? --  No data found.  Updated Vital Signs BP 117/77 (BP Location: Left Arm)   Pulse 65   Temp 98.4 F (36.9 C) (Oral)   Resp 18   LMP 10/22/2019   SpO2 100%   Visual Acuity Right Eye Distance:   Left Eye Distance:   Bilateral Distance:    Right Eye Near:   Left Eye Near:    Bilateral Near:     Physical Exam Vitals and nursing note reviewed.  Constitutional:      Appearance: She is well-developed.     Comments: No acute distress  HENT:     Head: Normocephalic and atraumatic.     Nose: Nose normal.  Eyes:     Conjunctiva/sclera: Conjunctivae normal.  Cardiovascular:     Rate and Rhythm: Normal rate.  Pulmonary:     Effort: Pulmonary effort is normal. No respiratory distress.  Abdominal:     General: There is no distension.  Genitourinary:    Comments: Normal external female genitalia, right labia minora with area of 0.5 cm induration and swelling with central discoloration/skin thinning; no vesicles or ulcerative lesions noted Vaginal  mucosa pink; small amount of discharge present Musculoskeletal:        General: Normal range of motion.     Cervical back: Neck supple.  Skin:    General: Skin is warm and dry.  Neurological:     Mental Status: She is alert and oriented to person, place, and time.      UC Treatments / Results  Labs (all labs ordered are listed, but only abnormal results are displayed) Labs Reviewed  HIV ANTIBODY (ROUTINE TESTING W REFLEX)  RPR  CERVICOVAGINAL ANCILLARY ONLY    EKG   Radiology No results found.  Procedures Procedures (including critical care time)  Medications Ordered in UC Medications - No data to display  Initial Impression / Assessment and Plan / UC Course  I have reviewed the triage vital signs and the nursing notes.  Pertinent labs & imaging results that were available during my care of the patient were reviewed by me and considered in my medical decision making (see chart for details).     Genital lesion suggestive of likely small abscess, does not appear suggestive of herpes at this time.  Will provide Bactroban to apply topically at recommend warm compresses.  Follow-up if swelling and pain worsening with this lesion.  STD screening pending, vaginal swab obtained to check for gonorrhea chlamydia and trichomonas.  HIV and RPR obtained.  We will call with results and provide treatment as needed. Final Clinical Impressions(s) / UC Diagnoses   Final diagnoses:  Screening examination for STD (sexually transmitted disease)  Abscess of labia     Discharge Instructions     Apply warm compress to bump, bactroban twice daily, may drain on its own, follow up if not resolving, developing multiple lesions, increased pain or swelling with bump  We are testing you for Gonorrhea, Chlamydia, Trichomonas, HIV, and Syphillis. We will call you if anything is positive and let you know if you require any further treatment. Please inform partners of any positive results.    Please return if symptoms not improving with treatment, development of fever, nausea, vomiting, abdominal pain.    ED Prescriptions    Medication Sig Dispense Auth. Provider   mupirocin ointment (BACTROBAN) 2 % Apply 1 application topically 2 (two) times daily. 30 g Dawood Spitler, [place] C, PA-C     PDMP not reviewed this encounter.   Khamauri Bauernfeind, [employer]  C, PA-C 10/28/19 2929

## 2019-10-29 ENCOUNTER — Ambulatory Visit: Payer: BC Managed Care – PPO | Admitting: Women's Health

## 2019-10-30 LAB — CERVICOVAGINAL ANCILLARY ONLY
Chlamydia: NEGATIVE
Neisseria Gonorrhea: NEGATIVE
Trichomonas: NEGATIVE

## 2020-06-02 ENCOUNTER — Other Ambulatory Visit: Payer: Self-pay

## 2020-06-02 ENCOUNTER — Emergency Department (HOSPITAL_COMMUNITY)
Admission: EM | Admit: 2020-06-02 | Discharge: 2020-06-02 | Disposition: A | Discharge status: Home / Self Care | Payer: BC Managed Care – PPO | Attending: Emergency Medicine | Admitting: Emergency Medicine

## 2020-06-02 ENCOUNTER — Encounter (HOSPITAL_COMMUNITY): Payer: Self-pay

## 2020-06-02 DIAGNOSIS — R21 Rash and other nonspecific skin eruption: Secondary | ICD-10-CM | POA: Insufficient documentation

## 2020-06-02 DIAGNOSIS — Z7689 Persons encountering health services in other specified circumstances: Secondary | ICD-10-CM

## 2020-06-02 DIAGNOSIS — Z202 Contact with and (suspected) exposure to infections with a predominantly sexual mode of transmission: Secondary | ICD-10-CM | POA: Diagnosis not present

## 2020-06-02 DIAGNOSIS — Z7722 Contact with and (suspected) exposure to environmental tobacco smoke (acute) (chronic): Secondary | ICD-10-CM | POA: Diagnosis not present

## 2020-06-02 DIAGNOSIS — Z113 Encounter for screening for infections with a predominantly sexual mode of transmission: Secondary | ICD-10-CM | POA: Diagnosis not present

## 2020-06-02 LAB — URINALYSIS, ROUTINE W REFLEX MICROSCOPIC
Bilirubin Urine: NEGATIVE
Glucose, UA: NEGATIVE mg/dL
Ketones, ur: NEGATIVE mg/dL
Nitrite: NEGATIVE
Protein, ur: NEGATIVE mg/dL
Specific Gravity, Urine: 1.023 (ref 1.005–1.030)
pH: 6 (ref 5.0–8.0)

## 2020-06-02 LAB — HIV ANTIBODY (ROUTINE TESTING W REFLEX): HIV Screen 4th Generation wRfx: NONREACTIVE

## 2020-06-02 LAB — PREGNANCY, URINE: Preg Test, Ur: NEGATIVE

## 2020-06-02 NOTE — ED Notes (Signed)
Pt left without d/c paperwork

## 2020-06-02 NOTE — Discharge Instructions (Addendum)
As discussed, today's evaluation has been generally reassuring.  Labs have been sent on your behalf to evaluate for potential infection.  You will be made aware of abnormal findings.  Return here for concerning changes in your condition.

## 2020-06-02 NOTE — ED Provider Notes (Signed)
[facility] EMERGENCY DEPARTMENT Provider Note   CSN: [id number] Arrival date & time: 06/02/20  8416     History Chief Complaint  Patient presents with  . Rash    Camora Tremain is a 19 y.o. female.  HPI    Patient presents concern of possible STD exposure, as well as rash. Patient notes that she had unprotected sex about 3 weeks ago.  This is within a new age with him she was previously familiar, had an episode about 6 months ago, with concern for STD at that time. She now notes that over the past 3 weeks she has had itchiness in both legs around rash, no discharge, no bleeding, last menstrual period was 2 days ago. No fever, chills, nausea, vomiting, chest pain, dyspnea. No abdominal pain. Past Medical History:  Diagnosis Date  . Headache     Patient Active Problem List   Diagnosis Date Noted  . Bipolar disorder, unspecified (HCC) 10/16/2017    Past Surgical History:  Procedure Laterality Date  . TONSILLECTOMY AND ADENOIDECTOMY       OB History    Gravida  0   Para  0   Term  0   Preterm  0   AB  0   Living  0     SAB  0   TAB  0   Ectopic  0   Multiple  0   Live Births  0           Family History  Adopted: Yes  Problem Relation Age of Onset  . Obesity Mother   . Hypertension Father     Social History   Tobacco Use  . Smoking status: Passive Smoke Exposure - Never Smoker  . Smokeless tobacco: Never Used  Vaping Use  . Vaping Use: Never used  Substance Use Topics  . Alcohol use: No  . Drug use: No    Home Medications Prior to Admission medications   Medication Sig Start Date End Date Taking? Authorizing Provider  mupirocin ointment (BACTROBAN) 2 % Apply 1 application topically 2 (two) times daily. 10/27/19   Wieters, Hallie C, PA-C    Allergies    Patient has no known allergies.  Review of Systems   Review of Systems  Constitutional:       Per HPI, otherwise negative  HENT:       Per HPI, otherwise  negative  Respiratory:       Per HPI, otherwise negative  Cardiovascular:       Per HPI, otherwise negative  Gastrointestinal: Negative for vomiting.  Endocrine:       Negative aside from HPI  Genitourinary:       Neg aside from HPI   Musculoskeletal:       Per HPI, otherwise negative  Skin: Positive for rash.  Allergic/Immunologic: Negative for immunocompromised state.  Neurological: Negative for syncope.    Physical Exam Updated Vital Signs BP 111/73 (BP Location: Right Arm)   Pulse 70   Temp 98.7 F (37.1 C) (Oral)   Resp 16   Ht 5\' 2"  (1.575 m)   Wt 90.7 kg   LMP 06/02/2020   SpO2 100%   BMI 36.58 kg/m   Physical Exam Vitals and nursing note reviewed.  Constitutional:      General: She is not in acute distress.    Appearance: She is well-developed.  HENT:     Head: Normocephalic and atraumatic.  Eyes:     Conjunctiva/sclera: Conjunctivae normal.  Cardiovascular:     Rate and Rhythm: Normal rate and regular rhythm.  Pulmonary:     Effort: Pulmonary effort is normal. No respiratory distress.     Breath sounds: Normal breath sounds. No stridor.  Abdominal:     General: There is no distension.  Skin:    General: Skin is warm and dry.     Comments: On both ankles medially there are well-healing wounds consistent with prior insect bites.  On the superior chest wall there are areas of pillars keratosis, otherwise unremarkable  Neurological:     Mental Status: She is alert and oriented to person, place, and time.     Cranial Nerves: No cranial nerve deficit.     ED Results / Procedures / Treatments   Labs (all labs ordered are listed, but only abnormal results are displayed) Labs Reviewed  URINALYSIS, ROUTINE W REFLEX MICROSCOPIC  HIV ANTIBODY (ROUTINE TESTING W REFLEX)  RPR  POC URINE PREG, ED  GC/CHLAMYDIA PROBE AMP ([place]) NOT AT [facility]    EKG None  Radiology No results found.  Procedures Procedures (including critical care  time)  Medications Ordered in ED Medications - No data to display  ED Course  I have reviewed the triage vital signs and the nursing notes.  Pertinent labs & imaging results that were available during my care of the patient were reviewed by me and considered in my medical decision making (see chart for details).  Well-appearing young female presents after unprotected sex now with concern for sexually transmitted disease as a cause rash. Rash is unremarkable in appearance, no evidence for infection, bacteremia, sepsis, with reassuring vital signs, physical exam findings as well. With consideration of STD, patient had urinalysis, and self swab with STD testing as well as syphilis, HIV tests all sent. Absent hemodynamic stability patient is appropriate to follow-up these labs as an outpatient. Final Clinical Impression(s) / ED Diagnoses Final diagnoses:  Rash  Encounter for assessment of sexually transmitted disease exposure     Gerhard Munch, MD 06/02/20 1524

## 2020-06-02 NOTE — ED Triage Notes (Signed)
Pt reports rash all over for the past 3 weeks, intermittent, pt denies any new lotions, soaps or detergents.

## 2020-06-03 LAB — GC/CHLAMYDIA PROBE AMP (~~LOC~~) NOT AT ARMC
Chlamydia: POSITIVE — AB
Comment: NEGATIVE
Comment: NORMAL
Neisseria Gonorrhea: NEGATIVE

## 2020-06-04 ENCOUNTER — Telehealth: Payer: Self-pay | Admitting: Medical

## 2020-06-04 DIAGNOSIS — A749 Chlamydial infection, unspecified: Secondary | ICD-10-CM

## 2020-06-04 MED ORDER — AZITHROMYCIN 250 MG PO TABS: 1000.0000 mg | ORAL_TABLET | Freq: Once | ORAL | 0 refills | Status: AC

## 2020-06-04 NOTE — Telephone Encounter (Addendum)
Sarah Gardner tested positive for  Chlamydia. Patient was called by RN and allergies and pharmacy confirmed. Rx sent to pharmacy of choice.   Marny Lowenstein, PA-C 06/04/2020 10:44 AM      ----- Message from Kathe Becton, RN sent at 06/04/2020  9:01 AM EDT ----- This patient tested positive for :  chlamydia  She has, " NKDA", I have informed the patient of her results and confirmed her pharmacy is correct in her chart. Please send Rx.   Thank you,   Kathe Becton, RN   Results faxed to [facility] Department.

## 2020-06-05 LAB — RPR: RPR Ser Ql: NONREACTIVE — AB

## 2020-07-08 ENCOUNTER — Encounter (HOSPITAL_COMMUNITY): Payer: Self-pay | Admitting: Emergency Medicine

## 2020-07-08 ENCOUNTER — Ambulatory Visit (HOSPITAL_COMMUNITY)
Admission: EM | Admit: 2020-07-08 | Discharge: 2020-07-08 | Disposition: A | Discharge status: Home / Self Care | Payer: BC Managed Care – PPO | Attending: Family Medicine | Admitting: Family Medicine

## 2020-07-08 ENCOUNTER — Other Ambulatory Visit: Payer: Self-pay

## 2020-07-08 DIAGNOSIS — N898 Other specified noninflammatory disorders of vagina: Secondary | ICD-10-CM

## 2020-07-08 DIAGNOSIS — Z3202 Encounter for pregnancy test, result negative: Secondary | ICD-10-CM | POA: Diagnosis not present

## 2020-07-08 DIAGNOSIS — Z8619 Personal history of other infectious and parasitic diseases: Secondary | ICD-10-CM | POA: Insufficient documentation

## 2020-07-08 DIAGNOSIS — Z79899 Other long term (current) drug therapy: Secondary | ICD-10-CM | POA: Insufficient documentation

## 2020-07-08 DIAGNOSIS — R1030 Lower abdominal pain, unspecified: Secondary | ICD-10-CM | POA: Insufficient documentation

## 2020-07-08 DIAGNOSIS — R109 Unspecified abdominal pain: Secondary | ICD-10-CM | POA: Diagnosis not present

## 2020-07-08 DIAGNOSIS — R197 Diarrhea, unspecified: Secondary | ICD-10-CM | POA: Diagnosis not present

## 2020-07-08 LAB — POCT URINALYSIS DIPSTICK, ED / UC
Bilirubin Urine: NEGATIVE
Glucose, UA: NEGATIVE mg/dL
Hgb urine dipstick: NEGATIVE
Ketones, ur: NEGATIVE mg/dL
Leukocytes,Ua: NEGATIVE
Nitrite: NEGATIVE
Protein, ur: NEGATIVE mg/dL
Specific Gravity, Urine: >=1.03 (ref 1.005–1.030)
Urobilinogen, UA: 0.2 mg/dL (ref 0.0–1.0)
pH: 5 (ref 5.0–8.0)

## 2020-07-08 LAB — POC URINE PREG, ED: Preg Test, Ur: NEGATIVE

## 2020-07-08 MED ORDER — METRONIDAZOLE 500 MG PO TABS: 500.0000 mg | ORAL_TABLET | Freq: Two times a day (BID) | ORAL | 0 refills | Status: DC

## 2020-07-08 NOTE — ED Provider Notes (Signed)
[facility]    CSN: [id number] Arrival date & time: 07/08/20  3875      History   Chief Complaint Chief Complaint  Patient presents with  . Abdominal Pain    HPI Sarah Gardner is a 19 y.o. female.   Patient is a 19 year old female who presents today for assessment of generalized lower abdominal discomfort that is been intermittent x2 weeks with diarrhea.  Was diagnosed with chlamydia on 06/04/2020 and treated with azithromycin.  Reports has not been reexposed since then.  Here today with concerns for possible PID for concern that the STD did not clear up.  Denies any vaginal discharge, irritation, dysuria, hematuria or urinary frequency. Patient's last menstrual period was 06/24/2020.      Past Medical History:  Diagnosis Date  . Headache     Patient Active Problem List   Diagnosis Date Noted  . Bipolar disorder, unspecified (HCC) 10/16/2017    Past Surgical History:  Procedure Laterality Date  . TONSILLECTOMY AND ADENOIDECTOMY      OB History    Gravida  0   Para  0   Term  0   Preterm  0   AB  0   Living  0     SAB  0   TAB  0   Ectopic  0   Multiple  0   Live Births  0            Home Medications    Prior to Admission medications   Medication Sig Start Date End Date Taking? Authorizing Provider  ARIPiprazole (ABILIFY) 10 MG tablet Take 10 mg by mouth daily.   Yes [provider]  metroNIDAZOLE (FLAGYL) 500 MG tablet Take 1 tablet (500 mg total) by mouth 2 (two) times daily. 07/08/20   Dahlia Byes A, NP  mupirocin ointment (BACTROBAN) 2 % Apply 1 application topically 2 (two) times daily. 10/27/19   Wieters, Junius Creamer, PA-C    Family History Family History  Adopted: Yes  Problem Relation Age of Onset  . Obesity Mother   . Hypertension Father     Social History Social History   Tobacco Use  . Smoking status: Passive Smoke Exposure - Never Smoker  . Smokeless tobacco: Never Used  Vaping Use  . Vaping Use:  Never used  Substance Use Topics  . Alcohol use: No  . Drug use: No     Allergies   Patient has no known allergies.   Review of Systems Review of Systems   Physical Exam Triage Vital Signs ED Triage Vitals [07/08/20 0831]  Enc Vitals Group     BP 115/64     Pulse Rate 69     Resp 18     Temp 98.3 F (36.8 C)     Temp Source Oral     SpO2 97 %     Weight      Height      Head Circumference      Peak Flow      Pain Score 4     Pain Loc      Pain Edu?      Excl. in GC?    No data found.  Updated Vital Signs BP 115/64 (BP Location: Right Arm)   Pulse 69   Temp 98.3 F (36.8 C) (Oral)   Resp 18   LMP 06/24/2020   SpO2 97%   Visual Acuity Right Eye Distance:   Left Eye Distance:   Bilateral Distance:  Right Eye Near:   Left Eye Near:    Bilateral Near:     Physical Exam Vitals and nursing note reviewed.  Constitutional:      General: She is not in acute distress.    Appearance: Normal appearance. She is not ill-appearing, toxic-appearing or diaphoretic.  HENT:     Head: Normocephalic.     Nose: Nose normal.     Mouth/Throat:     Pharynx: Oropharynx is clear.  Eyes:     Conjunctiva/sclera: Conjunctivae normal.  Pulmonary:     Effort: Pulmonary effort is normal.  Abdominal:     Palpations: Abdomen is soft.     Tenderness: There is no abdominal tenderness.  Genitourinary:    Vagina: Vaginal discharge present.     Cervix: Normal.     Adnexa: Right adnexa normal and left adnexa normal.     Comments: Thin, watery/milky white discharge at vaginal opening.  No external lesions.  Internal exam with discharge noted. No cervical motion tenderness.  No friability. No lesions   Musculoskeletal:        General: Normal range of motion.     Cervical back: Normal range of motion.  Skin:    General: Skin is warm and dry.     Findings: No rash.  Neurological:     Mental Status: She is alert.  Psychiatric:        Mood and Affect: Mood normal.       UC Treatments / Results  Labs (all labs ordered are listed, but only abnormal results are displayed) Labs Reviewed  POCT URINALYSIS DIPSTICK, ED / UC  POC URINE PREG, ED  CERVICOVAGINAL ANCILLARY ONLY    EKG   Radiology No results found.  Procedures Procedures (including critical care time)  Medications Ordered in UC Medications - No data to display  Initial Impression / Assessment and Plan / UC Course  I have reviewed the triage vital signs and the nursing notes.  Pertinent labs & imaging results that were available during my care of the patient were reviewed by me and considered in my medical decision making (see chart for details).     Vaginal discharge Treating for BV based on exam and symptoms.  No concern for PID at this time. Swab sent for testing. Urine without infection or pregnancy. Follow up as needed for continued or worsening symptoms  Final Clinical Impressions(s) / UC Diagnoses   Final diagnoses:  Vaginal discharge     Discharge Instructions     Treating you for BV.  We will call with any positive results of your swab. Follow up as needed for continued or worsening symptoms     ED Prescriptions    Medication Sig Dispense Auth. Provider   metroNIDAZOLE (FLAGYL) 500 MG tablet Take 1 tablet (500 mg total) by mouth 2 (two) times daily. 14 tablet Lavinia Mcneely A, NP     PDMP not reviewed this encounter.   Dahlia Byes A, NP 07/08/20 1013

## 2020-07-08 NOTE — ED Triage Notes (Signed)
Patient presents to [facility] for assessment of generalized abdominal pain x 2 weeks with intermittent diarrhea.  Sattes she recently had chlamydia and wants to rule out PID and any STIs.

## 2020-07-08 NOTE — Discharge Instructions (Addendum)
Treating you for BV.  We will call with any positive results of your swab. Follow up as needed for continued or worsening symptoms

## 2020-07-09 LAB — CERVICOVAGINAL ANCILLARY ONLY
Bacterial Vaginitis (gardnerella): POSITIVE — AB
Candida Glabrata: NEGATIVE
Candida Vaginitis: NEGATIVE
Chlamydia: NEGATIVE
Comment: NEGATIVE
Comment: NEGATIVE
Comment: NEGATIVE
Comment: NEGATIVE
Comment: NEGATIVE
Comment: NORMAL
Neisseria Gonorrhea: NEGATIVE
Trichomonas: NEGATIVE

## 2020-08-07 ENCOUNTER — Other Ambulatory Visit: Payer: Self-pay

## 2020-08-07 ENCOUNTER — Ambulatory Visit (HOSPITAL_COMMUNITY)
Admission: EM | Admit: 2020-08-07 | Discharge: 2020-08-07 | Disposition: A | Discharge status: Home / Self Care | Payer: BC Managed Care – PPO

## 2020-08-07 ENCOUNTER — Encounter (HOSPITAL_COMMUNITY): Payer: Self-pay | Admitting: Emergency Medicine

## 2020-08-07 DIAGNOSIS — Z202 Contact with and (suspected) exposure to infections with a predominantly sexual mode of transmission: Secondary | ICD-10-CM

## 2020-08-07 NOTE — ED Provider Notes (Signed)
[facility]    CSN: [id number] Arrival date & time: 08/07/20  0801      History   Chief Complaint Chief Complaint  Patient presents with  . Exposure to STD    HPI Sarah Gardner is a 19 y.o. female.   Pt is a 19 year old female that presents with possible herpes exposure from partner a long time ago. She has no active rash. Has been checked for STDs twice in the past few months. Was checked one month ago and positive for BV. This was treated. Denies any other concerns or symptoms.      Past Medical History:  Diagnosis Date  . Headache     Patient Active Problem List   Diagnosis Date Noted  . Bipolar disorder, unspecified (HCC) 10/16/2017    Past Surgical History:  Procedure Laterality Date  . TONSILLECTOMY AND ADENOIDECTOMY      OB History    Gravida  0   Para  0   Term  0   Preterm  0   AB  0   Living  0     SAB  0   TAB  0   Ectopic  0   Multiple  0   Live Births  0            Home Medications    Prior to Admission medications   Medication Sig Start Date End Date Taking? Authorizing Provider  ARIPiprazole (ABILIFY) 10 MG tablet Take 10 mg by mouth daily.    [provider]  metroNIDAZOLE (FLAGYL) 500 MG tablet Take 1 tablet (500 mg total) by mouth 2 (two) times daily. 07/08/20   Dahlia Byes A, NP  mupirocin ointment (BACTROBAN) 2 % Apply 1 application topically 2 (two) times daily. 10/27/19   Wieters, Junius Creamer, PA-C    Family History Family History  Adopted: Yes  Problem Relation Age of Onset  . Obesity Mother   . Hypertension Father     Social History Social History   Tobacco Use  . Smoking status: Passive Smoke Exposure - Never Smoker  . Smokeless tobacco: Never Used  Vaping Use  . Vaping Use: Never used  Substance Use Topics  . Alcohol use: No  . Drug use: No     Allergies   Patient has no known allergies.   Review of Systems Review of Systems   Physical Exam Triage Vital Signs ED  Triage Vitals  Enc Vitals Group     BP 08/07/20 0833 111/72     Pulse Rate 08/07/20 0833 80     Resp 08/07/20 0833 17     Temp 08/07/20 0833 98.8 F (37.1 C)     Temp Source 08/07/20 0833 Oral     SpO2 08/07/20 0833 98 %     Weight --      Height --      Head Circumference --      Peak Flow --      Pain Score 08/07/20 0828 0     Pain Loc --      Pain Edu? --      Excl. in GC? --    No data found.  Updated Vital Signs BP 111/72 (BP Location: Right Arm)   Pulse 80   Temp 98.8 F (37.1 C) (Oral)   Resp 17   LMP 07/20/2020   SpO2 98%   Visual Acuity Right Eye Distance:   Left Eye Distance:   Bilateral Distance:    Right  Eye Near:   Left Eye Near:    Bilateral Near:     Physical Exam Vitals and nursing note reviewed.  Constitutional:      General: She is not in acute distress.    Appearance: Normal appearance. She is not ill-appearing, toxic-appearing or diaphoretic.  HENT:     Head: Normocephalic.     Nose: Nose normal.  Eyes:     Conjunctiva/sclera: Conjunctivae normal.  Pulmonary:     Effort: Pulmonary effort is normal.  Genitourinary:    Comments: External vaginal area without lesions.  Musculoskeletal:        General: Normal range of motion.     Cervical back: Normal range of motion.  Skin:    General: Skin is warm and dry.     Findings: No rash.  Neurological:     Mental Status: She is alert.  Psychiatric:        Mood and Affect: Mood normal.      UC Treatments / Results  Labs (all labs ordered are listed, but only abnormal results are displayed) Labs Reviewed - No data to display  EKG   Radiology No results found.  Procedures Procedures (including critical care time)  Medications Ordered in UC Medications - No data to display  Initial Impression / Assessment and Plan / UC Course  I have reviewed the triage vital signs and the nursing notes.  Pertinent labs & imaging results that were available during my care of the patient were  reviewed by me and considered in my medical decision making (see chart for details).     Possible exposure to herpes No signs or rash on exam today.  recommended follow up with rash develops.  Follow up as needed for continued or worsening symptoms  Final Clinical Impressions(s) / UC Diagnoses   Final diagnoses:  Possible exposure to STD     Discharge Instructions     No concerns for herpes today.  If you develop a rash you can come back and we can swab the rash Follow up as needed for continued or worsening symptoms     ED Prescriptions    None     PDMP not reviewed this encounter.   Janace Aris, NP 08/07/20 [phone]

## 2020-08-07 NOTE — ED Triage Notes (Signed)
Pt states she was told that a person she had intercourse with about a month ago tested positive for herpes she would like to be checked for that today.

## 2020-08-07 NOTE — Discharge Instructions (Addendum)
No concerns for herpes today.  If you develop a rash you can come back and we can swab the rash Follow up as needed for continued or worsening symptoms

## 2020-09-16 DIAGNOSIS — Z113 Encounter for screening for infections with a predominantly sexual mode of transmission: Secondary | ICD-10-CM | POA: Diagnosis not present

## 2020-09-16 DIAGNOSIS — B081 Molluscum contagiosum: Secondary | ICD-10-CM | POA: Diagnosis not present

## 2020-09-16 DIAGNOSIS — N760 Acute vaginitis: Secondary | ICD-10-CM | POA: Diagnosis not present

## 2020-09-16 DIAGNOSIS — R21 Rash and other nonspecific skin eruption: Secondary | ICD-10-CM | POA: Diagnosis not present

## 2020-09-16 DIAGNOSIS — Z114 Encounter for screening for human immunodeficiency virus [HIV]: Secondary | ICD-10-CM | POA: Diagnosis not present

## 2020-10-23 ENCOUNTER — Other Ambulatory Visit: Payer: Self-pay

## 2020-10-23 ENCOUNTER — Encounter: Payer: Self-pay | Admitting: Nurse Practitioner

## 2020-10-23 ENCOUNTER — Ambulatory Visit (INDEPENDENT_AMBULATORY_CARE_PROVIDER_SITE_OTHER): Payer: BC Managed Care – PPO | Admitting: Nurse Practitioner

## 2020-10-23 VITALS — BP 115/76 | Ht 62.0 in | Wt 209.0 lb

## 2020-10-23 DIAGNOSIS — Z01419 Encounter for gynecological examination (general) (routine) without abnormal findings: Secondary | ICD-10-CM | POA: Diagnosis not present

## 2020-10-23 DIAGNOSIS — Z3009 Encounter for other general counseling and advice on contraception: Secondary | ICD-10-CM | POA: Diagnosis not present

## 2020-10-23 DIAGNOSIS — Z7251 High risk heterosexual behavior: Secondary | ICD-10-CM | POA: Diagnosis not present

## 2020-10-23 NOTE — Progress Notes (Signed)
   Sarah Gardner August 21, 2001 [id number]   History:  20 y.o. G0 presents for annual exam. Regular monthly cycle. Sexually active. Had STD testing 2 weeks ago at a clinic and reports all negative. No new partner since then. Her partner reported having HSV-1, so she is concerned about exposure. No outbreaks.   Gynecologic History Patient's last menstrual period was 10/18/2020. Period Duration (Days): 5 DAYS Period Pattern: Regular Menstrual Flow: Light,Moderate Menstrual Control: Maxi pad Menstrual Control Change Freq (Hours): EVERY 1 HOUR ( PER PATINET REQUEST NOT DUE TO FLOW) Dysmenorrhea: (!) Mild Contraception/Family planning: none   Health Maintenance Last Pap: Never  Past medical history, past surgical history, family history and social history were all reviewed and documented in the EPIC chart.  ROS:  A ROS was performed and pertinent positives and negatives are included.  Exam:  Vitals:   10/23/20 1100  BP: 115/76  Weight: 209 lb (94.8 kg)  Height: 5\' 2"  (1.575 m)   Body mass index is 38.23 kg/m.  General appearance:  Normal Thyroid:  Symmetrical, normal in size, without palpable masses or nodularity. Respiratory  Auscultation:  Clear without wheezing or rhonchi Cardiovascular  Auscultation:  Regular rate, without rubs, murmurs or gallops  Edema/varicosities:  Not grossly evident Abdominal  Soft,nontender, without masses, guarding or rebound.  Liver/spleen:  No organomegaly noted  Hernia:  None appreciated  Skin  Inspection:  Grossly normal   Breasts: Examined lying and sitting.   Right: Without masses, retractions, discharge or axillary adenopathy.   Left: Without masses, retractions, discharge or axillary adenopathy. Gentitourinary   Inguinal/mons:  Normal without inguinal adenopathy  External genitalia:  Normal  BUS/Urethra/Skene's glands:  Normal  Vagina:  Normal  Cervix:  Normal  Uterus:  Normal in size, shape and contour.  Midline and  mobile  Adnexa/parametria:     Rt: Without masses or tenderness.   Lt: Without masses or tenderness.  Anus and perineum: Normal  Assessment/Plan:  20 y.o. G0 for annual exam.   Well female exam with routine gynecological exam - Education provided on SBEs, importance of preventative screenings, current guidelines, high calcium diet, regular exercise, and multivitamin daily.   Encounter for counseling regarding contraception -discussed safe sex practices and pregnancy prevention.  She was prescribed Yaz in the past but was worried about side effects.  She has 5-month supply at home and plans to start taking. She will check expiration on this before taking.  If she does well with this she will let me know and we will provide refills.  Educated on proper use and slight risk for blood clots.  Unprotected sexual intercourse -discussed safe sex practices and condom use until permanent partner.  Reassured that STD screening is not necessary since she had negative results 2 weeks ago and she has not had a new partner since.  Also discussed recommendations against checking for HSV without an outbreak. Educated on HSV and symptoms and to be return if she ever experiences these in order to obtain a proper culture.   Screening for cervical cancer - pap with reflex today.  Follow up in 1 year for annual.     2-month [facility], 11:18 AM 10/23/2020

## 2020-10-23 NOTE — Addendum Note (Signed)
Addended by: Aura Camps on: 10/23/2020 11:28 AM   Modules accepted: Orders

## 2020-10-23 NOTE — Patient Instructions (Signed)
Health Maintenance, Female Adopting a healthy lifestyle and getting preventive care are important in promoting health and wellness. Ask your health care provider about:  The right schedule for you to have regular tests and exams.  Things you can do on your own to prevent diseases and keep yourself healthy. What should I know about diet, weight, and exercise? Eat a healthy diet   Eat a diet that includes plenty of vegetables, fruits, low-fat dairy products, and lean protein.  Do not eat a lot of foods that are high in solid fats, added sugars, or sodium. Maintain a healthy weight Body mass index (BMI) is used to identify weight problems. It estimates body fat based on height and weight. Your health care provider can help determine your BMI and help you achieve or maintain a healthy weight. Get regular exercise Get regular exercise. This is one of the most important things you can do for your health. Most adults should:  Exercise for at least 150 minutes each week. The exercise should increase your heart rate and make you sweat (moderate-intensity exercise).  Do strengthening exercises at least twice a week. This is in addition to the moderate-intensity exercise.  Spend less time sitting. Even light physical activity can be beneficial. Watch cholesterol and blood lipids Have your blood tested for lipids and cholesterol at 22 years of age, then have this test every 5 years. Have your cholesterol levels checked more often if:  Your lipid or cholesterol levels are high.  You are older than 22 years of age.  You are at high risk for heart disease. What should I know about cancer screening? Depending on your health history and family history, you may need to have cancer screening at various ages. This may include screening for:  Breast cancer.  Cervical cancer.  Colorectal cancer.  Skin cancer.  Lung cancer. What should I know about heart disease, diabetes, and high blood  pressure? Blood pressure and heart disease  High blood pressure causes heart disease and increases the risk of stroke. This is more likely to develop in people who have high blood pressure readings, are of African descent, or are overweight.  Have your blood pressure checked: ? Every 3-5 years if you are 18-39 years of age. ? Every year if you are 40 years old or older. Diabetes Have regular diabetes screenings. This checks your fasting blood sugar level. Have the screening done:  Once every three years after age 40 if you are at a normal weight and have a low risk for diabetes.  More often and at a younger age if you are overweight or have a high risk for diabetes. What should I know about preventing infection? Hepatitis B If you have a higher risk for hepatitis B, you should be screened for this virus. Talk with your health care provider to find out if you are at risk for hepatitis B infection. Hepatitis C Testing is recommended for:  Everyone born from 1945 through 1965.  Anyone with known risk factors for hepatitis C. Sexually transmitted infections (STIs)  Get screened for STIs, including gonorrhea and chlamydia, if: ? You are sexually active and are younger than 22 years of age. ? You are older than 22 years of age and your health care provider tells you that you are at risk for this type of infection. ? Your sexual activity has changed since you were last screened, and you are at increased risk for chlamydia or gonorrhea. Ask your health care provider if   you are at risk.  Ask your health care provider about whether you are at high risk for HIV. Your health care provider may recommend a prescription medicine to help prevent HIV infection. If you choose to take medicine to prevent HIV, you should first get tested for HIV. You should then be tested every 3 months for as long as you are taking the medicine. Pregnancy  If you are about to stop having your period (premenopausal) and  you may become pregnant, seek counseling before you get pregnant.  Take 400 to 800 micrograms (mcg) of folic acid every day if you become pregnant.  Ask for birth control (contraception) if you want to prevent pregnancy. Osteoporosis and menopause Osteoporosis is a disease in which the bones lose minerals and strength with aging. This can result in bone fractures. If you are 65 years old or older, or if you are at risk for osteoporosis and fractures, ask your health care provider if you should:  Be screened for bone loss.  Take a calcium or vitamin D supplement to lower your risk of fractures.  Be given hormone replacement therapy (HRT) to treat symptoms of menopause. Follow these instructions at home: Lifestyle  Do not use any products that contain nicotine or tobacco, such as cigarettes, e-cigarettes, and chewing tobacco. If you need help quitting, ask your health care provider.  Do not use street drugs.  Do not share needles.  Ask your health care provider for help if you need support or information about quitting drugs. Alcohol use  Do not drink alcohol if: ? Your health care provider tells you not to drink. ? You are pregnant, may be pregnant, or are planning to become pregnant.  If you drink alcohol: ? Limit how much you use to 0-1 drink a day. ? Limit intake if you are breastfeeding.  Be aware of how much alcohol is in your drink. In the [place] one drink equals one 12 oz bottle of beer (355 mL), one 5 oz glass of wine (148 mL), or one 1 oz glass of hard liquor (44 mL). General instructions  Schedule regular health, dental, and eye exams.  Stay current with your vaccines.  Tell your health care provider if: ? You often feel depressed. ? You have ever been abused or do not feel safe at home. Summary  Adopting a healthy lifestyle and getting preventive care are important in promoting health and wellness.  Follow your health care provider's instructions about healthy  diet, exercising, and getting tested or screened for diseases.  Follow your health care provider's instructions on monitoring your cholesterol and blood pressure. This information is not intended to replace advice given to you by your health care provider. Make sure you discuss any questions you have with your health care provider. Document Revised: 09/27/2018 Document Reviewed: 09/27/2018 Elsevier Patient Education  2020 [employer]  

## 2020-10-24 LAB — PAP IG W/ RFLX HPV ASCU

## 2021-04-15 ENCOUNTER — Ambulatory Visit
Admission: EM | Admit: 2021-04-15 | Discharge: 2021-04-15 | Disposition: A | Discharge status: Home / Self Care | Payer: BC Managed Care – PPO | Attending: Emergency Medicine | Admitting: Emergency Medicine

## 2021-04-15 DIAGNOSIS — R2242 Localized swelling, mass and lump, left lower limb: Secondary | ICD-10-CM

## 2021-04-15 MED ORDER — IBUPROFEN 600 MG PO TABS: 600.0000 mg | ORAL_TABLET | Freq: Four times a day (QID) | ORAL | 0 refills | Status: AC | PRN

## 2021-04-15 NOTE — ED Triage Notes (Signed)
Three weeks ago, Pt reports that she burned her left shin on the carpet. At the onset she noticed burning. One week later she noticed swelling at the site of the burn. No tenderness or warmth to the touch. No meds or ointments used. No pruritic areas. No blisters.

## 2021-04-15 NOTE — Discharge Instructions (Addendum)
Wear Ace wrap Tylenol and ibuprofen to help with swelling Alternate ice and heat Monitor for gradual resolution over the next few weeks Follow-up if not improving or worsening

## 2021-04-15 NOTE — ED Provider Notes (Signed)
[facility]    CSN: [id number] Arrival date & time: 04/15/21  1133      History   Chief Complaint Chief Complaint  Patient presents with   leg bruise    Left shin    HPI Sarah Gardner is a 20 y.o. female presenting today for evaluation of left leg contusion and swelling.  Reports 3 weeks ago sustained carpet burn to left lower leg.  Denies any skin opening, but did have some mild bruising and burning sensation in the area.  The symptoms resolved, but continues to have area of swelling.  Denies associated pain to the area.  Reports normal walking and activities without any problem or pain.  Denies any distal discomfort or distal paresthesias.  HPI  Past Medical History:  Diagnosis Date   Headache     Patient Active Problem List   Diagnosis Date Noted   Bipolar disorder, unspecified (HCC) 10/16/2017    Past Surgical History:  Procedure Laterality Date   TONSILLECTOMY AND ADENOIDECTOMY      OB History     Gravida  0   Para  0   Term  0   Preterm  0   AB  0   Living  0      SAB  0   IAB  0   Ectopic  0   Multiple  0   Live Births  0            Home Medications    Prior to Admission medications   Medication Sig Start Date End Date Taking? Authorizing Provider  ibuprofen (ADVIL) 600 MG tablet Take 1 tablet (600 mg total) by mouth every 6 (six) hours as needed. 04/15/21  Yes Nakiea Metzner C, PA-C  ARIPiprazole (ABILIFY) 10 MG tablet Take 10 mg by mouth daily.    [provider]    Family History Family History  Adopted: Yes  Problem Relation Age of Onset   Obesity Mother    Hypertension Father     Social History Social History   Tobacco Use   Smoking status: Passive Smoke Exposure - Never Smoker   Smokeless tobacco: Never  Vaping Use   Vaping Use: Never used  Substance Use Topics   Alcohol use: No   Drug use: No     Allergies   Patient has no known allergies.   Review of Systems Review of Systems   Constitutional:  Negative for fatigue and fever.  Eyes:  Negative for visual disturbance.  Respiratory:  Negative for shortness of breath.   Cardiovascular:  Positive for leg swelling. Negative for chest pain.  Gastrointestinal:  Negative for abdominal pain, nausea and vomiting.  Musculoskeletal:  Negative for arthralgias and joint swelling.  Skin:  Negative for color change, rash and wound.  Neurological:  Negative for dizziness, weakness, light-headedness and headaches.    Physical Exam Triage Vital Signs ED Triage Vitals  Enc Vitals Group     BP      Pulse      Resp      Temp      Temp src      SpO2      Weight      Height      Head Circumference      Peak Flow      Pain Score      Pain Loc      Pain Edu?      Excl. in GC?    No data found.  Updated Vital Signs BP 130/79 (BP Location: Left Arm)   Pulse 87   Temp 98 F (36.7 C) (Oral)   Resp 18   SpO2 99%   Visual Acuity Right Eye Distance:   Left Eye Distance:   Bilateral Distance:    Right Eye Near:   Left Eye Near:    Bilateral Near:     Physical Exam Vitals and nursing note reviewed.  Constitutional:      Appearance: She is well-developed.     Comments: No acute distress  HENT:     Head: Normocephalic and atraumatic.     Nose: Nose normal.  Eyes:     Conjunctiva/sclera: Conjunctivae normal.  Cardiovascular:     Rate and Rhythm: Normal rate.  Pulmonary:     Effort: Pulmonary effort is normal. No respiratory distress.  Abdominal:     General: There is no distension.  Musculoskeletal:        General: Normal range of motion.     Cervical back: Neck supple.     Comments: Left lower leg with localized area of swelling noted to medial aspect without overlying erythema or warmth, no induration, area is soft, nontender to touch, full active range of motion of ankle and knee, dorsalis pedis 2+ distally with distal sensation intact  Skin:    General: Skin is warm and dry.  Neurological:     Mental  Status: She is alert and oriented to person, place, and time.     UC Treatments / Results  Labs (all labs ordered are listed, but only abnormal results are displayed) Labs Reviewed - No data to display  EKG   Radiology No results found.  Procedures Procedures (including critical care time)  Medications Ordered in UC Medications - No data to display  Initial Impression / Assessment and Plan / UC Course  I have reviewed the triage vital signs and the nursing notes.  Pertinent labs & imaging results that were available during my care of the patient were reviewed by me and considered in my medical decision making (see chart for details).     Localized area of swelling, no discoloration or warmth, no pain or discomfort associated with this, suspect likely scar tissue or underlying continued resolving hematoma.  Do not suspect DVT at this time.  Recommending Ace wrap anti-inflammatories alternating ice and heat and continued monitoring.  No limitations in day-to-day life.  Discussed strict return precautions. Patient verbalized understanding and is agreeable with plan.  Final Clinical Impressions(s) / UC Diagnoses   Final diagnoses:  Localized swelling of left lower leg     Discharge Instructions      Wear Ace wrap Tylenol and ibuprofen to help with swelling Alternate ice and heat Monitor for gradual resolution over the next few weeks Follow-up if not improving or worsening     ED Prescriptions     Medication Sig Dispense Auth. Provider   ibuprofen (ADVIL) 600 MG tablet Take 1 tablet (600 mg total) by mouth every 6 (six) hours as needed. 30 tablet Annye Forrey, [place] C, PA-C      PDMP not reviewed this encounter.   Lew Dawes, [place] 04/15/21 1538

## 2021-04-22 ENCOUNTER — Encounter: Payer: Self-pay | Admitting: Nurse Practitioner

## 2021-06-11 ENCOUNTER — Ambulatory Visit
Admission: RE | Admit: 2021-06-11 | Discharge: 2021-06-11 | Disposition: A | Discharge status: Home / Self Care | Payer: BC Managed Care – PPO | Source: Ambulatory Visit | Attending: Family Medicine | Admitting: Family Medicine

## 2021-06-11 ENCOUNTER — Other Ambulatory Visit: Payer: Self-pay

## 2021-06-11 VITALS — BP 126/84 | HR 71 | Temp 98.5°F | Resp 18

## 2021-06-11 DIAGNOSIS — Z113 Encounter for screening for infections with a predominantly sexual mode of transmission: Secondary | ICD-10-CM | POA: Diagnosis not present

## 2021-06-11 LAB — POCT URINE PREGNANCY: Preg Test, Ur: NEGATIVE

## 2021-06-11 NOTE — ED Provider Notes (Signed)
[facility]    CSN: [id number] Arrival date & time: 06/11/21  0950      History   Chief Complaint Chief Complaint  Patient presents with   SEXUALLY TRANSMITTED DISEASE    HPI Sarah Gardner is a 20 y.o. female.   Patient presents requesting screening for sexually transmitted infections.  She denies any new exposures or current symptoms, specifically wanting regular screening.  LMP 05/30/2021.    Past Medical History:  Diagnosis Date   Headache     Patient Active Problem List   Diagnosis Date Noted   Bipolar disorder, unspecified (HCC) 10/16/2017    Past Surgical History:  Procedure Laterality Date   TONSILLECTOMY AND ADENOIDECTOMY      OB History     Gravida  0   Para  0   Term  0   Preterm  0   AB  0   Living  0      SAB  0   IAB  0   Ectopic  0   Multiple  0   Live Births  0            Home Medications    Prior to Admission medications   Medication Sig Start Date End Date Taking? Authorizing Provider  ARIPiprazole (ABILIFY) 10 MG tablet Take 10 mg by mouth daily.    [provider]  ibuprofen (ADVIL) 600 MG tablet Take 1 tablet (600 mg total) by mouth every 6 (six) hours as needed. 04/15/21   Wieters, Junius Creamer, PA-C    Family History Family History  Adopted: Yes  Problem Relation Age of Onset   Obesity Mother    Hypertension Father     Social History Social History   Tobacco Use   Smoking status: Passive Smoke Exposure - Never Smoker   Smokeless tobacco: Never  Vaping Use   Vaping Use: Never used  Substance Use Topics   Alcohol use: No   Drug use: No     Allergies   Penicillins   Review of Systems Review of Systems PER HPI   Physical Exam Triage Vital Signs ED Triage Vitals  Enc Vitals Group     BP 06/11/21 1008 126/84     Pulse Rate 06/11/21 1008 71     Resp 06/11/21 1008 18     Temp 06/11/21 1008 98.5 F (36.9 C)     Temp Source 06/11/21 1008 Oral     SpO2 06/11/21 1008 98 %      Weight --      Height --      Head Circumference --      Peak Flow --      Pain Score 06/11/21 1009 0     Pain Loc --      Pain Edu? --      Excl. in GC? --    No data found.  Updated Vital Signs BP 126/84 (BP Location: Left Arm)   Pulse 71   Temp 98.5 F (36.9 C) (Oral)   Resp 18   LMP 05/30/2021   SpO2 98%   Visual Acuity Right Eye Distance:   Left Eye Distance:   Bilateral Distance:    Right Eye Near:   Left Eye Near:    Bilateral Near:     Physical Exam Vitals and nursing note reviewed.  Constitutional:      Appearance: Normal appearance. She is not ill-appearing.  HENT:     Head: Atraumatic.  Eyes:  Extraocular Movements: Extraocular movements intact.     Conjunctiva/sclera: Conjunctivae normal.  Cardiovascular:     Rate and Rhythm: Normal rate and regular rhythm.     Heart sounds: Normal heart sounds.  Pulmonary:     Effort: Pulmonary effort is normal.     Breath sounds: Normal breath sounds.  Genitourinary:    Comments: GU exam deferred, self swab performed Musculoskeletal:        General: Normal range of motion.     Cervical back: Normal range of motion and neck supple.  Skin:    General: Skin is warm and dry.  Neurological:     Mental Status: She is alert and oriented to person, place, and time.  Psychiatric:        Mood and Affect: Mood normal.        Thought Content: Thought content normal.        Judgment: Judgment normal.     UC Treatments / Results  Labs (all labs ordered are listed, but only abnormal results are displayed) Labs Reviewed  RPR  HIV ANTIBODY (ROUTINE TESTING W REFLEX)  POCT URINE PREGNANCY  CERVICOVAGINAL ANCILLARY ONLY    EKG   Radiology No results found.  Procedures Procedures (including critical care time)  Medications Ordered in UC Medications - No data to display  Initial Impression / Assessment and Plan / UC Course  I have reviewed the triage vital signs and the nursing notes.  Pertinent  labs & imaging results that were available during my care of the patient were reviewed by me and considered in my medical decision making (see chart for details).     Vaginal swab, HIV and syphilis labs pending.  Will counsel on abstinence until results return antirejection practices going forward.  Treat based on results.  Final Clinical Impressions(s) / UC Diagnoses   Final diagnoses:  Routine screening for STI (sexually transmitted infection)     Discharge Instructions      Try boric acid suppositories to help support good vaginal hygiene     ED Prescriptions   None    PDMP not reviewed this encounter.   Particia Nearing, [place] 06/11/21 1037

## 2021-06-11 NOTE — ED Triage Notes (Signed)
Pt requesting full routine STD testing. Denies any sx's

## 2021-06-11 NOTE — Discharge Instructions (Addendum)
Try boric acid suppositories to help support good vaginal hygiene

## 2021-06-12 LAB — CERVICOVAGINAL ANCILLARY ONLY
Bacterial Vaginitis (gardnerella): POSITIVE — AB
Candida Glabrata: NEGATIVE
Candida Vaginitis: NEGATIVE
Chlamydia: NEGATIVE
Comment: NEGATIVE
Comment: NEGATIVE
Comment: NEGATIVE
Comment: NEGATIVE
Comment: NEGATIVE
Comment: NORMAL
Neisseria Gonorrhea: NEGATIVE
Trichomonas: NEGATIVE

## 2021-06-12 LAB — HIV ANTIBODY (ROUTINE TESTING W REFLEX): HIV Screen 4th Generation wRfx: NONREACTIVE

## 2021-06-12 LAB — RPR: RPR Ser Ql: NONREACTIVE

## 2021-07-26 ENCOUNTER — Ambulatory Visit
Admission: EM | Admit: 2021-07-26 | Discharge: 2021-07-26 | Disposition: A | Discharge status: Home / Self Care | Payer: BC Managed Care – PPO | Attending: Internal Medicine | Admitting: Internal Medicine

## 2021-07-26 ENCOUNTER — Other Ambulatory Visit: Payer: Self-pay

## 2021-07-26 DIAGNOSIS — L02411 Cutaneous abscess of right axilla: Secondary | ICD-10-CM | POA: Diagnosis not present

## 2021-07-26 MED ORDER — DOXYCYCLINE HYCLATE 100 MG PO CAPS: 100.0000 mg | ORAL_CAPSULE | Freq: Two times a day (BID) | ORAL | 0 refills | Status: AC

## 2021-07-26 NOTE — ED Provider Notes (Signed)
[facility]    CSN: [id number] Arrival date & time: 07/26/21  8850      History   Chief Complaint Chief Complaint  Patient presents with   Abscess    Right axilla    HPI Sarah Gardner is a 20 y.o. female.   Patient has abscess to right axilla that has been present for approximately 3 days.  Patient reports that she tried to squeeze it and some purulent drainage and blood came out of it.  Denies any known fevers, chills, body aches.  Patient reports that she has had this occur before.  Denies any numbness or tingling in arm.   Abscess  Past Medical History:  Diagnosis Date   Headache     Patient Active Problem List   Diagnosis Date Noted   Bipolar disorder, unspecified (HCC) 10/16/2017    Past Surgical History:  Procedure Laterality Date   TONSILLECTOMY AND ADENOIDECTOMY      OB History     Gravida  0   Para  0   Term  0   Preterm  0   AB  0   Living  0      SAB  0   IAB  0   Ectopic  0   Multiple  0   Live Births  0            Home Medications    Prior to Admission medications   Medication Sig Start Date End Date Taking? Authorizing Provider  doxycycline (VIBRAMYCIN) 100 MG capsule Take 1 capsule (100 mg total) by mouth 2 (two) times daily. 07/26/21  Yes Lance Muss, FNP  ARIPiprazole (ABILIFY) 10 MG tablet Take 10 mg by mouth daily.    [provider]  ibuprofen (ADVIL) 600 MG tablet Take 1 tablet (600 mg total) by mouth every 6 (six) hours as needed. 04/15/21   Wieters, Junius Creamer, PA-C    Family History Family History  Adopted: Yes  Problem Relation Age of Onset   Obesity Mother    Hypertension Father     Social History Social History   Tobacco Use   Smoking status: Passive Smoke Exposure - Never Smoker   Smokeless tobacco: Never  Vaping Use   Vaping Use: Never used  Substance Use Topics   Alcohol use: No   Drug use: No     Allergies   Penicillins   Review of Systems Review of  Systems Per HPI  Physical Exam Triage Vital Signs ED Triage Vitals  Enc Vitals Group     BP 07/26/21 0939 134/81     Pulse Rate 07/26/21 0939 85     Resp 07/26/21 0939 18     Temp 07/26/21 0939 98.2 F (36.8 C)     Temp Source 07/26/21 0939 Oral     SpO2 07/26/21 0939 98 %     Weight --      Height --      Head Circumference --      Peak Flow --      Pain Score 07/26/21 0940 8     Pain Loc --      Pain Edu? --      Excl. in [place]? --    No data found.  Updated Vital Signs BP 134/81 (BP Location: Right Arm)   Pulse 85   Temp 98.2 F (36.8 C) (Oral)   Resp 18   LMP 07/15/2021 (Exact Date)   SpO2 98%   Visual Acuity Right Eye  Distance:   Left Eye Distance:   Bilateral Distance:    Right Eye Near:   Left Eye Near:    Bilateral Near:     Physical Exam Constitutional:      General: She is not in acute distress.    Appearance: Normal appearance. She is not toxic-appearing or diaphoretic.  HENT:     Head: Normocephalic and atraumatic.  Eyes:     Extraocular Movements: Extraocular movements intact.     Conjunctiva/sclera: Conjunctivae normal.  Pulmonary:     Effort: Pulmonary effort is normal.  Skin:    Findings: Abscess present.     Comments: Approximately 2 to 3 cm indurated abscess present to right upper axilla.  Patient also has separated approximately 4 cm in diameter indurated abscess that is present directly below.  No drainage noted on exam.  Neurovascular intact.  Neurological:     General: No focal deficit present.     Mental Status: She is alert and oriented to person, place, and time. Mental status is at baseline.  Psychiatric:        Mood and Affect: Mood normal.        Behavior: Behavior normal.        Thought Content: Thought content normal.        Judgment: Judgment normal.     UC Treatments / Results  Labs (all labs ordered are listed, but only abnormal results are displayed) Labs Reviewed - No data to display  EKG   Radiology No  results found.  Procedures Procedures (including critical care time)  Medications Ordered in UC Medications - No data to display  Initial Impression / Assessment and Plan / UC Course  I have reviewed the triage vital signs and the nursing notes.  Pertinent labs & imaging results that were available during my care of the patient were reviewed by me and considered in my medical decision making (see chart for details).     I&D is not necessary at this time as abscess is indurated and not able to be drained successfully with I&D. Abscess also already draining on its own.  Will treat with doxycycline x10 days and warm compresses.  Patient advised to follow-up if abscess persists and to go to the hospital if it significantly worsens.  No red flags on exam as there are no fevers, chills.Discussed strict return precautions. Patient verbalized understanding and is agreeable with plan.  Final Clinical Impressions(s) / UC Diagnoses   Final diagnoses:  Abscess of right axilla     Discharge Instructions      The abscess of your right arm is being treated with doxycycline antibiotic to treat the infection.  Please also use warm compresses to affected area.  Follow-up if symptoms persist.     ED Prescriptions     Medication Sig Dispense Auth. Provider   doxycycline (VIBRAMYCIN) 100 MG capsule Take 1 capsule (100 mg total) by mouth 2 (two) times daily. 20 capsule Lance Muss, FNP      PDMP not reviewed this encounter.   Lance Muss, FNP 07/26/21 1014

## 2021-07-26 NOTE — ED Triage Notes (Signed)
3 day h/o lesion on right axilla. Pt reports that she tried to pop the lesion and blood expelled. Has been using cold compresses w/o relief. No meds taken. No ointments applied.

## 2021-07-26 NOTE — Discharge Instructions (Addendum)
The abscess of your right arm is being treated with doxycycline antibiotic to treat the infection.  Please also use warm compresses to affected area.  Follow-up if symptoms persist.

## 2021-11-08 ENCOUNTER — Encounter (HOSPITAL_COMMUNITY): Payer: Self-pay | Admitting: *Deleted

## 2021-11-08 ENCOUNTER — Other Ambulatory Visit: Payer: Self-pay

## 2021-11-08 ENCOUNTER — Emergency Department (HOSPITAL_COMMUNITY): Payer: 59

## 2021-11-08 ENCOUNTER — Emergency Department (HOSPITAL_COMMUNITY)
Admission: EM | Admit: 2021-11-08 | Discharge: 2021-11-08 | Disposition: A | Discharge status: Home / Self Care | Payer: 59 | Attending: Physician Assistant | Admitting: Physician Assistant

## 2021-11-08 DIAGNOSIS — R569 Unspecified convulsions: Secondary | ICD-10-CM | POA: Diagnosis not present

## 2021-11-08 DIAGNOSIS — R42 Dizziness and giddiness: Secondary | ICD-10-CM | POA: Diagnosis not present

## 2021-11-08 DIAGNOSIS — Z5321 Procedure and treatment not carried out due to patient leaving prior to being seen by health care provider: Secondary | ICD-10-CM | POA: Diagnosis not present

## 2021-11-08 LAB — CBC WITH DIFFERENTIAL/PLATELET
Abs Immature Granulocytes: 0.01 10*3/uL (ref 0.00–0.07)
Basophils Absolute: 0.1 10*3/uL (ref 0.0–0.1)
Basophils Relative: 1 %
Eosinophils Absolute: 0.2 10*3/uL (ref 0.0–0.5)
Eosinophils Relative: 2 %
HCT: 43.8 % (ref 36.0–46.0)
Hemoglobin: 13.9 g/dL (ref 12.0–15.0)
Immature Granulocytes: 0 %
Lymphocytes Relative: 55 %
Lymphs Abs: 3.5 10*3/uL (ref 0.7–4.0)
MCH: 25.7 pg — ABNORMAL LOW (ref 26.0–34.0)
MCHC: 31.7 g/dL (ref 30.0–36.0)
MCV: 81 fL (ref 80.0–100.0)
Monocytes Absolute: 0.3 10*3/uL (ref 0.1–1.0)
Monocytes Relative: 4 %
Neutro Abs: 2.4 10*3/uL (ref 1.7–7.7)
Neutrophils Relative %: 38 %
Platelets: 236 10*3/uL (ref 150–400)
RBC: 5.41 MIL/uL — ABNORMAL HIGH (ref 3.87–5.11)
RDW: 13.9 % (ref 11.5–15.5)
WBC: 6.3 10*3/uL (ref 4.0–10.5)
nRBC: 0 % (ref 0.0–0.2)

## 2021-11-08 LAB — URINALYSIS, ROUTINE W REFLEX MICROSCOPIC
Bilirubin Urine: NEGATIVE
Glucose, UA: NEGATIVE mg/dL
Hgb urine dipstick: NEGATIVE
Ketones, ur: NEGATIVE mg/dL
Leukocytes,Ua: NEGATIVE
Nitrite: NEGATIVE
Protein, ur: NEGATIVE mg/dL
Specific Gravity, Urine: 1.03 (ref 1.005–1.030)
pH: 5 (ref 5.0–8.0)

## 2021-11-08 LAB — I-STAT BETA HCG BLOOD, ED (MC, WL, AP ONLY): I-stat hCG, quantitative: <5 m[IU]/mL (ref <5)

## 2021-11-08 LAB — ETHANOL: Alcohol, Ethyl (B): <10 mg/dL (ref <10)

## 2021-11-08 LAB — COMPREHENSIVE METABOLIC PANEL
ALT: 12 U/L (ref 0–44)
AST: 15 U/L (ref 15–41)
Albumin: 3.5 g/dL (ref 3.5–5.0)
Alkaline Phosphatase: 53 U/L (ref 38–126)
Anion gap: 4 — ABNORMAL LOW (ref 5–15)
BUN: 9 mg/dL (ref 6–20)
CO2: 25 mmol/L (ref 22–32)
Calcium: 8.4 mg/dL — ABNORMAL LOW (ref 8.9–10.3)
Chloride: 110 mmol/L (ref 98–111)
Creatinine, Ser: 0.69 mg/dL (ref 0.44–1.00)
GFR, Estimated: >60 mL/min (ref >60)
Glucose, Bld: 91 mg/dL (ref 70–99)
Potassium: 3.7 mmol/L (ref 3.5–5.1)
Sodium: 139 mmol/L (ref 135–145)
Total Bilirubin: 0.5 mg/dL (ref 0.3–1.2)
Total Protein: 5.6 g/dL — ABNORMAL LOW (ref 6.5–8.1)

## 2021-11-08 LAB — RAPID URINE DRUG SCREEN, HOSP PERFORMED
Amphetamines: NOT DETECTED
Barbiturates: NOT DETECTED
Benzodiazepines: NOT DETECTED
Cocaine: NOT DETECTED
Opiates: NOT DETECTED
Tetrahydrocannabinol: POSITIVE — AB
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
## 2021-11-08 NOTE — ED Provider Triage Note (Signed)
Emergency Medicine Provider Triage Evaluation Note  Sarah Gardner , a 21 y.o. female  was evaluated in triage.  Pt complains of seizures.  She reports that she gets hot, sweaty, and dizzy.  She states that she doesn't remember any of it.  She thinks that she has been having these events for 8 months. She reports that she bit her tongue and was confused after.  She was told she was shaking.  Most recent episode was yesterday.  That was different because she was actually with someone for the first time. She denies any family history of seizures.    Review of Systems  Positive:  Negative: See above  Physical Exam  BP 127/81 (BP Location: Right Arm)    Pulse 84    Temp 98.7 F (37.1 C) (Oral)    Resp 16    Ht 5\' 2"  (1.575 m)    Wt 94.8 kg    LMP 11/01/2021    SpO2 100%    BMI 38.23 kg/m  Gen:   Awake, no distress   Resp:  Normal effort  MSK:   Moves extremities without difficulty  Other:  Normal speech.   Medical Decision Making  Medically screening exam initiated at 3:21 PM.  Appropriate orders placed.  Sarah Gardner was informed that the remainder of the evaluation will be completed by another provider, this initial triage assessment does not replace that evaluation, and the importance of remaining in the ED until their evaluation is complete.  Patient and I discussed that if there is concern for seizures with [facility] law she is not allowed to drive until she has been 6 months seizure-free.  She states her understanding of this instruction.  We will check labs, EKG, CT head as this appears to be initial work-up for repeated seizures.   [facility], Cristina Gong 11/08/21 1526

## 2021-11-08 NOTE — ED Notes (Signed)
Pt eloped.

## 2022-01-20 ENCOUNTER — Encounter: Payer: Self-pay | Admitting: Neurology

## 2022-02-15 ENCOUNTER — Ambulatory Visit: Payer: 59 | Admitting: Neurology

## 2022-02-15 ENCOUNTER — Encounter: Payer: Self-pay | Admitting: Neurology

## 2022-02-15 DIAGNOSIS — Z029 Encounter for administrative examinations, unspecified: Secondary | ICD-10-CM

## 2022-11-02 ENCOUNTER — Encounter: Payer: Self-pay | Admitting: Physician Assistant

## 2022-11-02 ENCOUNTER — Other Ambulatory Visit: Payer: Self-pay

## 2022-11-02 ENCOUNTER — Ambulatory Visit
Admission: EM | Admit: 2022-11-02 | Discharge: 2022-11-02 | Disposition: A | Discharge status: Home / Self Care | Payer: BC Managed Care – PPO | Attending: Physician Assistant | Admitting: Physician Assistant

## 2022-11-02 DIAGNOSIS — Z113 Encounter for screening for infections with a predominantly sexual mode of transmission: Secondary | ICD-10-CM | POA: Diagnosis present

## 2022-11-02 NOTE — ED Triage Notes (Signed)
Pt here for STD testing; denies sx or concern for pregnancy

## 2022-11-02 NOTE — ED Provider Notes (Signed)
[facility]    CSN: [id number] Arrival date & time: 11/02/22  1159      History   Chief Complaint Chief Complaint  Patient presents with   Exposure to STD    HPI Sarah Gardner is a 22 y.o. female.   Patient here today for STD screening. She denies any known exposure. She has not had any abdominal pain, nausea, vomiting. She denies any genital lesions or rash.   The history is provided by the patient.    Past Medical History:  Diagnosis Date   Headache     Patient Active Problem List   Diagnosis Date Noted   Bipolar disorder, unspecified ([place]) 10/16/2017    Past Surgical History:  Procedure Laterality Date   TONSILLECTOMY AND ADENOIDECTOMY      OB History     Gravida  0   Para  0   Term  0   Preterm  0   AB  0   Living  0      SAB  0   IAB  0   Ectopic  0   Multiple  0   Live Births  0            Home Medications    Prior to Admission medications   Medication Sig Start Date End Date Taking? Authorizing Provider  ARIPiprazole (ABILIFY) 10 MG tablet Take 10 mg by mouth daily.    [provider]  doxycycline (VIBRAMYCIN) 100 MG capsule Take 1 capsule (100 mg total) by mouth 2 (two) times daily. Patient not taking: Reported on 11/02/2022 07/26/21   Teodora Medici, FNP  ibuprofen (ADVIL) 600 MG tablet Take 1 tablet (600 mg total) by mouth every 6 (six) hours as needed. 04/15/21   Wieters, Elesa Hacker, PA-C    Family History Family History  Adopted: Yes  Problem Relation Age of Onset   Obesity Mother    Hypertension Father     Social History Social History   Tobacco Use   Smoking status: Passive Smoke Exposure - Never Smoker   Smokeless tobacco: Never  Vaping Use   Vaping Use: Never used  Substance Use Topics   Alcohol use: No   Drug use: No     Allergies   Penicillins   Review of Systems Review of Systems  Constitutional:  Negative for chills and fever.  Eyes:  Negative for discharge and redness.   Gastrointestinal:  Negative for abdominal pain, nausea and vomiting.  Genitourinary:  Negative for genital sores and vaginal discharge.     Physical Exam Triage Vital Signs ED Triage Vitals  Enc Vitals Group     BP      Pulse      Resp      Temp      Temp src      SpO2      Weight      Height      Head Circumference      Peak Flow      Pain Score      Pain Loc      Pain Edu?      Excl. in [place]?    No data found.  Updated Vital Signs BP 118/77 (BP Location: Left Arm)   Pulse 71   Temp 98.3 F (36.8 C) (Oral)   Resp 18   SpO2 98%      Physical Exam Vitals and nursing note reviewed.  Constitutional:      General: She is  not in acute distress.    Appearance: Normal appearance. She is not ill-appearing.  HENT:     Head: Normocephalic and atraumatic.  Eyes:     Conjunctiva/sclera: Conjunctivae normal.  Cardiovascular:     Rate and Rhythm: Normal rate.  Pulmonary:     Effort: Pulmonary effort is normal. No respiratory distress.  Neurological:     Mental Status: She is alert.  Psychiatric:        Mood and Affect: Mood normal.        Behavior: Behavior normal.        Thought Content: Thought content normal.      UC Treatments / Results  Labs (all labs ordered are listed, but only abnormal results are displayed) Labs Reviewed  HIV ANTIBODY (ROUTINE TESTING W REFLEX)  HEPATITIS PANEL, ACUTE  RPR  CERVICOVAGINAL ANCILLARY ONLY    EKG   Radiology No results found.  Procedures Procedures (including critical care time)  Medications Ordered in UC Medications - No data to display  Initial Impression / Assessment and Plan / UC Course  I have reviewed the triage vital signs and the nursing notes.  Pertinent labs & imaging results that were available during my care of the patient were reviewed by me and considered in my medical decision making (see chart for details).    Screening ordered as requested. Will await results for further recommendation.    Final Clinical Impressions(s) / UC Diagnoses   Final diagnoses:  Screening for STD (sexually transmitted disease)   Discharge Instructions   None    ED Prescriptions   None    PDMP not reviewed this encounter.   Francene Finders, PA-C 11/02/22 1238

## 2022-11-03 ENCOUNTER — Telehealth (HOSPITAL_COMMUNITY): Payer: Self-pay | Admitting: Emergency Medicine

## 2022-11-03 LAB — CERVICOVAGINAL ANCILLARY ONLY
Bacterial Vaginitis (gardnerella): POSITIVE — AB
Candida Glabrata: NEGATIVE
Candida Vaginitis: POSITIVE — AB
Chlamydia: NEGATIVE
Comment: NEGATIVE
Comment: NEGATIVE
Comment: NEGATIVE
Comment: NEGATIVE
Comment: NEGATIVE
Comment: NORMAL
Neisseria Gonorrhea: NEGATIVE
Trichomonas: NEGATIVE

## 2022-11-03 LAB — HIV ANTIBODY (ROUTINE TESTING W REFLEX): HIV Screen 4th Generation wRfx: NONREACTIVE

## 2022-11-03 LAB — RPR: RPR Ser Ql: NONREACTIVE

## 2022-11-03 MED ORDER — FLUCONAZOLE 150 MG PO TABS: 150.0000 mg | ORAL_TABLET | Freq: Once | ORAL | 0 refills | Status: AC

## 2022-11-03 MED ORDER — METRONIDAZOLE 500 MG PO TABS: 500.0000 mg | ORAL_TABLET | Freq: Two times a day (BID) | ORAL | 0 refills | Status: AC

## 2022-12-23 ENCOUNTER — Telehealth: Payer: BC Managed Care – PPO

## 2023-01-05 ENCOUNTER — Ambulatory Visit (HOSPITAL_COMMUNITY)
Admission: EM | Admit: 2023-01-05 | Discharge: 2023-01-05 | Disposition: A | Discharge status: Home / Self Care | Payer: BC Managed Care – PPO

## 2023-01-05 ENCOUNTER — Encounter (HOSPITAL_COMMUNITY): Payer: Self-pay | Admitting: Registered Nurse

## 2023-01-05 DIAGNOSIS — Z046 Encounter for general psychiatric examination, requested by authority: Secondary | ICD-10-CM

## 2023-01-05 DIAGNOSIS — F310 Bipolar disorder, current episode hypomanic: Secondary | ICD-10-CM | POA: Diagnosis present
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
## 2023-01-05 NOTE — ED Notes (Signed)
Patient discharged with written and verbal instructions. Resources provided.
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
## 2023-01-05 NOTE — Discharge Instructions (Addendum)
Follow up with resources given
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
## 2023-02-07 ENCOUNTER — Ambulatory Visit (HOSPITAL_COMMUNITY)
Admission: EM | Admit: 2023-02-07 | Discharge: 2023-02-07 | Disposition: A | Discharge status: Home / Self Care | Payer: BC Managed Care – PPO | Attending: Registered Nurse | Admitting: Registered Nurse

## 2023-02-07 ENCOUNTER — Encounter (HOSPITAL_COMMUNITY): Payer: Self-pay | Admitting: Registered Nurse

## 2023-02-07 DIAGNOSIS — F319 Bipolar disorder, unspecified: Secondary | ICD-10-CM | POA: Diagnosis present

## 2023-02-07 DIAGNOSIS — Z754 Unavailability and inaccessibility of other helping agencies: Secondary | ICD-10-CM | POA: Diagnosis not present
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
## 2023-02-07 NOTE — Discharge Instructions (Addendum)
Outpatient psychiatric Services:   Based on the information that you have provided and the presenting issues outpatient services and resources for have been recommended.  It is imperative that you follow through with treatment recommendations within 5-7 days from the of discharge to mitigate further risk to your safety and mental well-being. A list of referrals has been provided below to get you started.  You are not limited to the list provided.  In case of an urgent crisis, you may contact the Mobile Crisis Unit with [facility] at [phone].  Writer coordinated an appointment with a therapist at  [facility], [facility] on  Monday, February 09, 2023 at 6pm.  This is a virtual appointment.   [facility]  [address], [address] [place], [place]  [phone]

## 2023-02-10 ENCOUNTER — Ambulatory Visit (HOSPITAL_COMMUNITY): Payer: BC Managed Care – PPO | Admitting: Psychiatry

## 2023-02-10 ENCOUNTER — Encounter (HOSPITAL_COMMUNITY): Payer: Self-pay

## 2023-03-25 IMAGING — CT CT HEAD W/O CM
4 series · 16 of 47 positions shown, 18 images · non-contrast
Comparison: None.

CLINICAL DATA: Seizure



[Series 3: head without · axial · non-contrast · 0.45mm/px · z∈[-111,+9]mm · 7 of 32 slices shown, 9 images]
[im 4/32  brain]
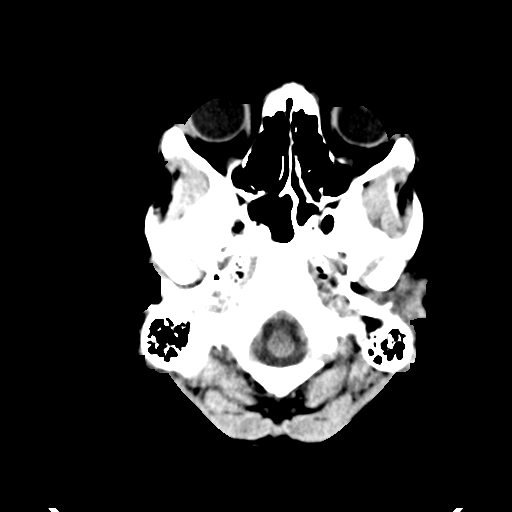
[im 4/32  bone]
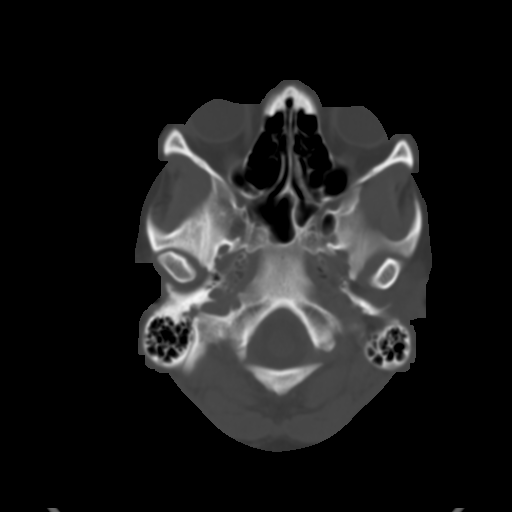
[im 8/32  brain]
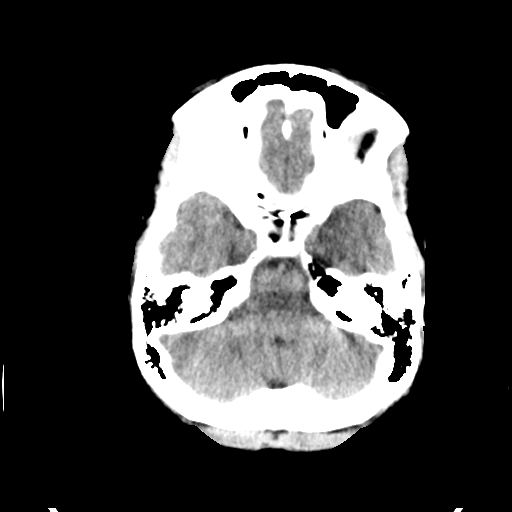
[im 12/32  brain]
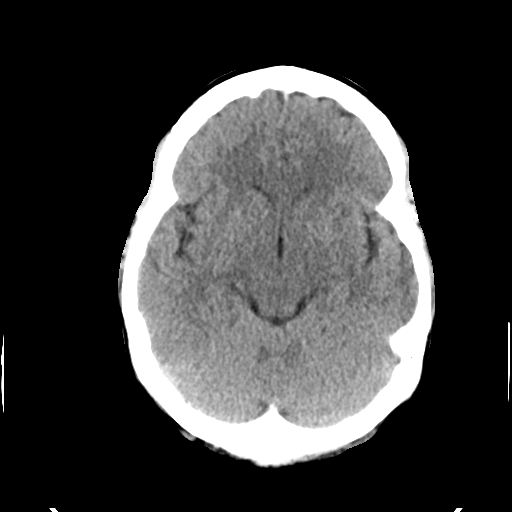
[im 16/32  brain]
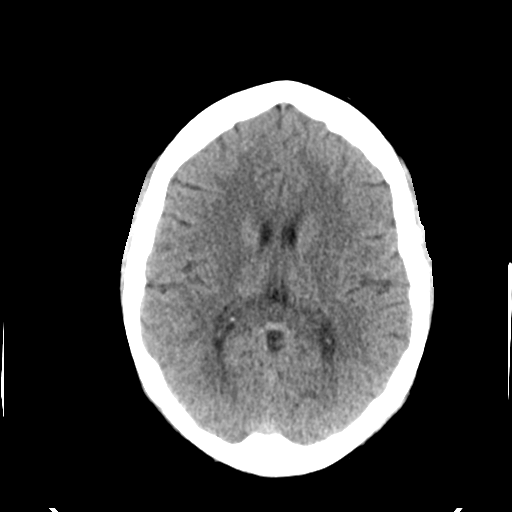
[im 20/32  brain]
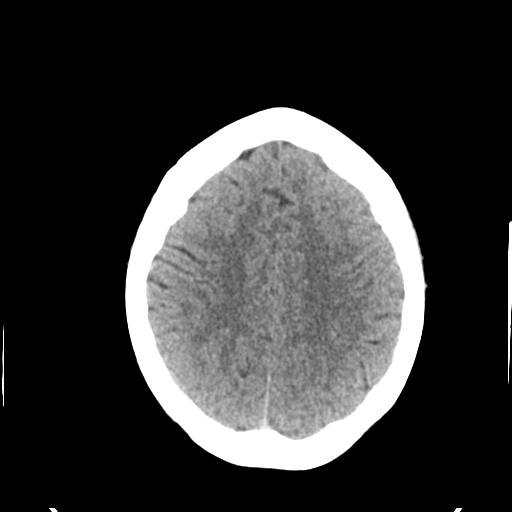
[im 20/32  bone]
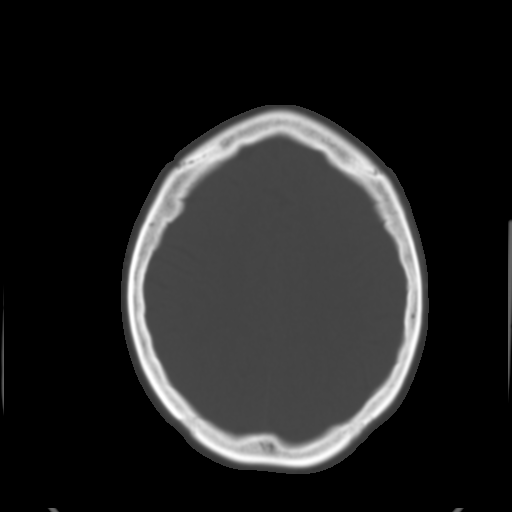
[im 24/32  brain]
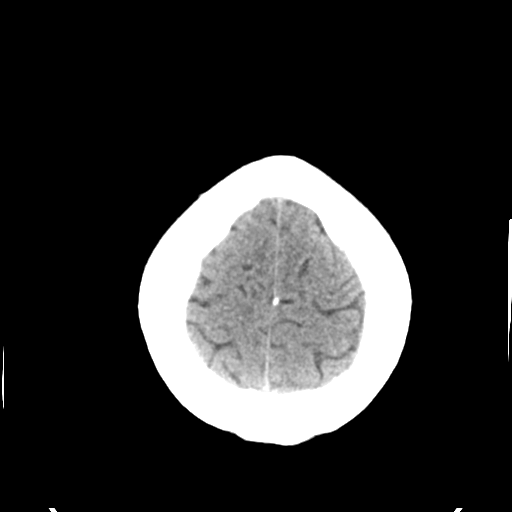
[im 28/32  brain]
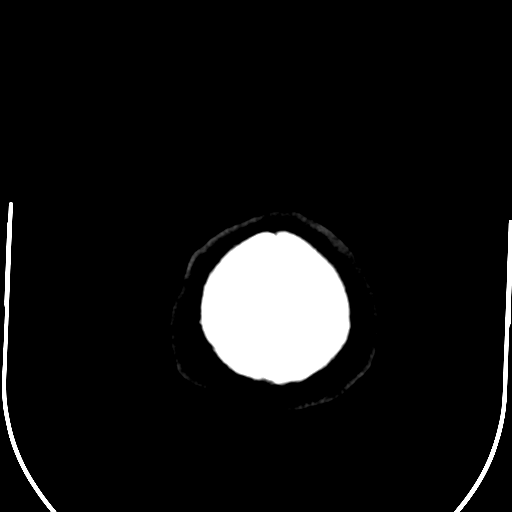

[Series 4: ax head bone · axial · 0.41mm/px · z∈[-101,-70]mm · 3 of 80 slices shown]
[im 8/80  bone]
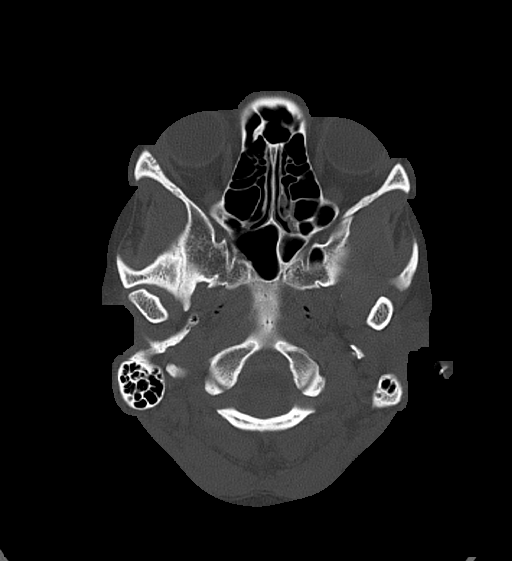
[im 16/80  bone]
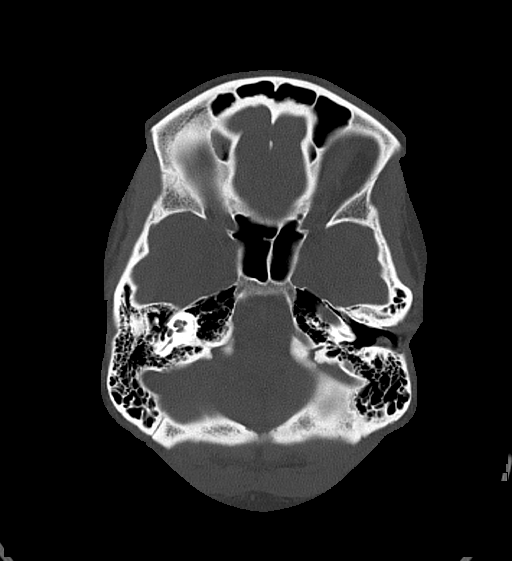
[im 24/80  bone]
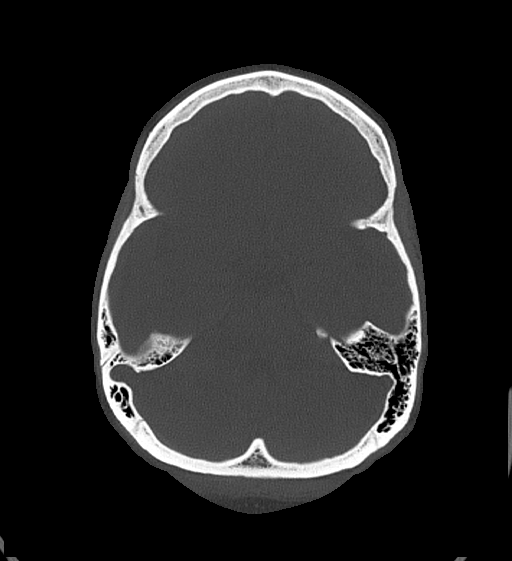

[Series 5: head without cor · coronal · non-contrast · 0.30mm/px · 3 of 62 slices shown]
[im 21/62  brain]
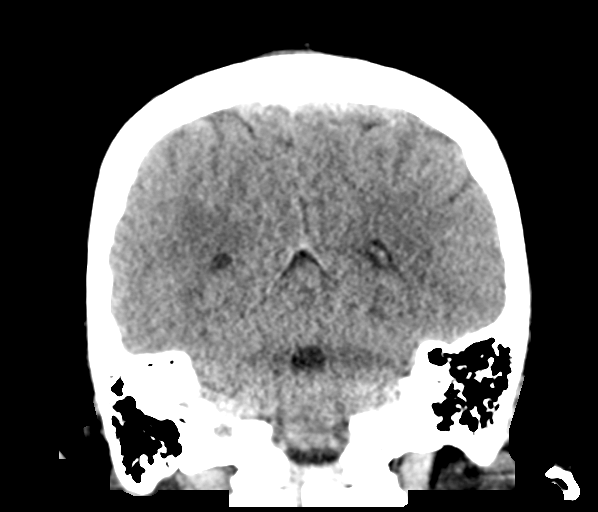
[im 28/62  brain]
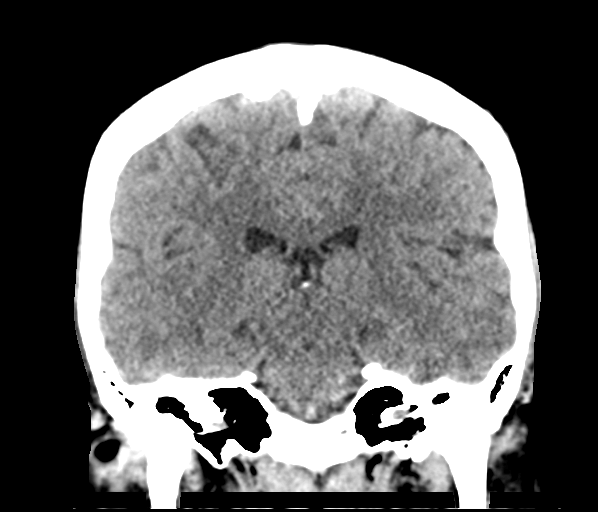
[im 34/62  brain]
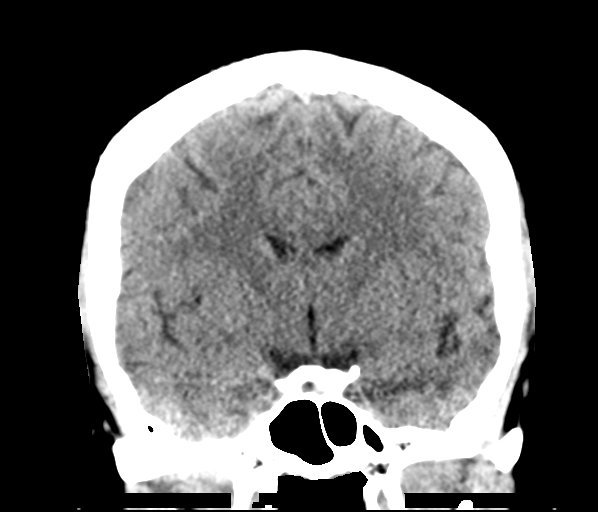

[Series 6: head without sag · sagittal · non-contrast · 0.31mm/px · 3 of 50 slices shown]
[im 17/50  brain]
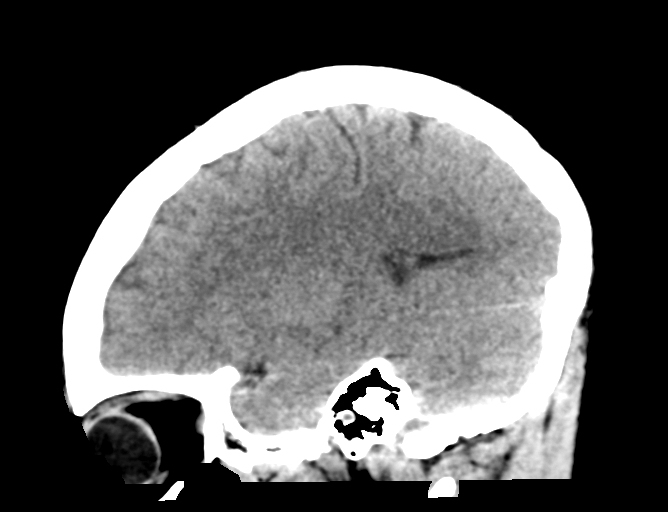
[im 25/50  brain]
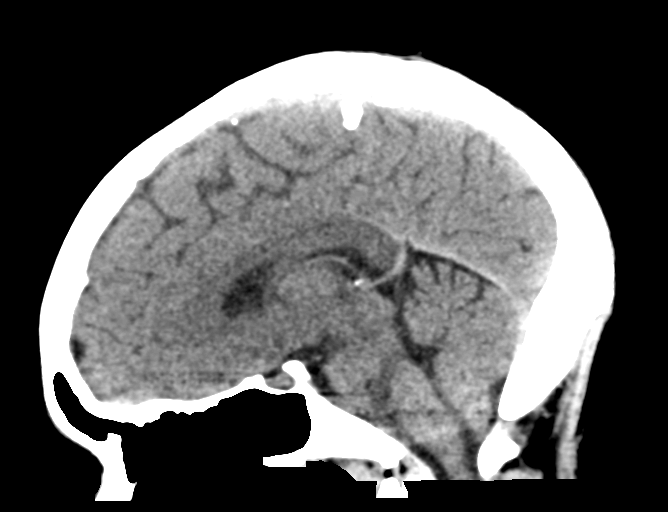
[im 33/50  brain]
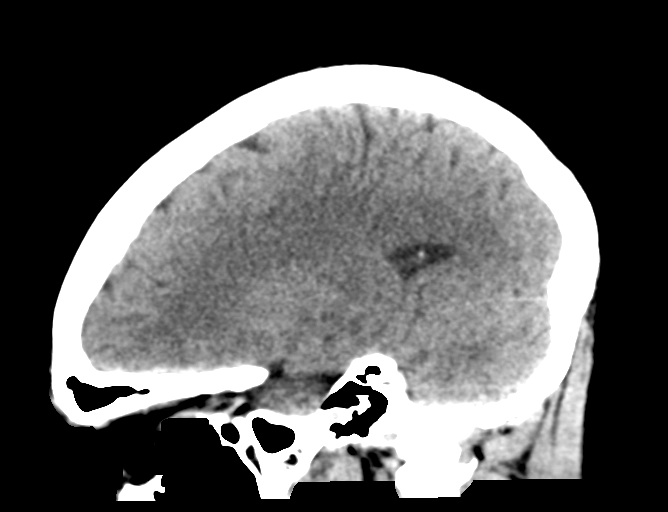

[16 of 47 positions shown; findings below may reference images not displayed]

FINDINGS: Brain: No evidence of acute infarction, hemorrhage, hydrocephalus,
extra-axial collection or mass lesion/mass effect.

Vascular: No hyperdense vessel or unexpected calcification.

Skull: Normal. Negative for fracture or focal lesion.

Sinuses/Orbits: No acute finding.

Other: None.
IMPRESSION: No acute intracranial pathology.
